# Patient Record
Sex: Female | Born: 1965
Health system: Southern US, Community
[De-identification: ages and names within clinical notes are randomized; demographics above are authoritative.]

## PROBLEM LIST (undated history)

## (undated) HISTORY — PX: REDUCTION MAMMAPLASTY: SUR839

---

## 1999-10-18 ENCOUNTER — Inpatient Hospital Stay (HOSPITAL_COMMUNITY): Admission: AD | Admit: 1999-10-18 | Discharge: 1999-10-20 | Payer: Self-pay | Admitting: Obstetrics and Gynecology

## 2001-03-07 ENCOUNTER — Other Ambulatory Visit: Admission: RE | Admit: 2001-03-07 | Discharge: 2001-03-07 | Payer: Self-pay | Admitting: Obstetrics and Gynecology

## 2002-04-19 ENCOUNTER — Other Ambulatory Visit: Admission: RE | Admit: 2002-04-19 | Discharge: 2002-04-19 | Payer: Self-pay | Admitting: Obstetrics and Gynecology

## 2003-06-13 ENCOUNTER — Other Ambulatory Visit: Admission: RE | Admit: 2003-06-13 | Discharge: 2003-06-13 | Payer: Self-pay | Admitting: Obstetrics and Gynecology

## 2005-06-30 ENCOUNTER — Encounter: Admission: RE | Admit: 2005-06-30 | Discharge: 2005-06-30 | Payer: Self-pay | Admitting: Obstetrics and Gynecology

## 2005-07-29 ENCOUNTER — Encounter: Admission: RE | Admit: 2005-07-29 | Discharge: 2005-07-29 | Payer: Self-pay | Admitting: Obstetrics and Gynecology

## 2006-02-09 ENCOUNTER — Encounter: Admission: RE | Admit: 2006-02-09 | Discharge: 2006-02-09 | Payer: Self-pay | Admitting: Obstetrics and Gynecology

## 2006-07-10 ENCOUNTER — Encounter: Admission: RE | Admit: 2006-07-10 | Discharge: 2006-07-10 | Payer: Self-pay | Admitting: Obstetrics and Gynecology

## 2007-08-09 ENCOUNTER — Encounter: Admission: RE | Admit: 2007-08-09 | Discharge: 2007-08-09 | Payer: Self-pay | Admitting: Obstetrics and Gynecology

## 2007-08-31 ENCOUNTER — Emergency Department (HOSPITAL_COMMUNITY): Admission: EM | Admit: 2007-08-31 | Discharge: 2007-08-31 | Payer: Self-pay | Admitting: Emergency Medicine

## 2008-08-20 ENCOUNTER — Encounter: Admission: RE | Admit: 2008-08-20 | Discharge: 2008-08-20 | Payer: Self-pay | Admitting: Obstetrics and Gynecology

## 2009-03-16 ENCOUNTER — Ambulatory Visit: Payer: Self-pay | Admitting: Vascular Surgery

## 2009-03-19 ENCOUNTER — Ambulatory Visit: Payer: Self-pay | Admitting: Vascular Surgery

## 2009-08-31 ENCOUNTER — Encounter: Admission: RE | Admit: 2009-08-31 | Discharge: 2009-08-31 | Payer: Self-pay | Admitting: Obstetrics and Gynecology

## 2009-09-01 ENCOUNTER — Ambulatory Visit: Payer: Self-pay | Admitting: Vascular Surgery

## 2009-10-05 ENCOUNTER — Ambulatory Visit: Payer: Self-pay | Admitting: Vascular Surgery

## 2009-10-13 ENCOUNTER — Ambulatory Visit: Payer: Self-pay | Admitting: Vascular Surgery

## 2010-08-14 ENCOUNTER — Other Ambulatory Visit: Payer: Self-pay | Admitting: Obstetrics and Gynecology

## 2010-08-14 DIAGNOSIS — Z1231 Encounter for screening mammogram for malignant neoplasm of breast: Secondary | ICD-10-CM

## 2010-08-14 DIAGNOSIS — Z1239 Encounter for other screening for malignant neoplasm of breast: Secondary | ICD-10-CM

## 2010-09-01 ENCOUNTER — Ambulatory Visit
Admission: RE | Admit: 2010-09-01 | Discharge: 2010-09-01 | Disposition: A | Discharge status: Home / Self Care | Payer: BC Managed Care – PPO | Source: Ambulatory Visit | Attending: Obstetrics and Gynecology | Admitting: Obstetrics and Gynecology

## 2010-09-01 DIAGNOSIS — Z1231 Encounter for screening mammogram for malignant neoplasm of breast: Secondary | ICD-10-CM

## 2010-12-07 NOTE — Procedures (Signed)
LOWER EXTREMITY VENOUS REFLUX EXAM   INDICATION:  Left lower extremity painful spider veins.   EXAM:  Using color-flow imaging and pulse Doppler spectral analysis, the  left common femoral, superficial femoral, popliteal, posterior tibial,  greater and lesser saphenous veins are evaluated.  There is evidence  suggesting deep venous insufficiency in the left lower extremity common  femoral vein.   The left saphenofemoral junction is not competent with reflux of  >532milliseconds. The left GSV is not competent with reflux of  >500  milliseconds, when the patient is upright with the caliber as described  below.   The left proximal short saphenous vein demonstrates competency.   GSV Diameter (used if found to be incompetent only)                                            Right    Left  Proximal Greater Saphenous Vein           cm       0.49 cm  Proximal-to-mid-thigh                     cm       cm  Mid thigh                                 cm       0.46 cm  Mid-distal thigh                          cm       cm  Distal thigh                              cm       0.51 cm  Knee                                      cm       0.5 cm   IMPRESSION:  1. Left greater saphenous vein reflux with >500 milliseconds is      identified when the patient is upright with the caliber ranging      from 0.46 cm to 0.51 cm knee to groin.  2. The left greater saphenous vein is not aneurysmal.  3. The left greater saphenous vein is not tortuous.  4. The deep venous system is not competent with reflux of  >500      milliseconds in the left common femoral vein.  5. The left lesser saphenous vein is competent.        ___________________________________________  Quita Skye. Hart Rochester, M.D.   AS/MEDQ  D:  09/01/2009  T:  09/02/2009  Job:  [id number]

## 2010-12-07 NOTE — Assessment & Plan Note (Signed)
OFFICE VISIT   KARYSA, HEFT  DOB:  1965-12-03                                       10/05/2009  ZOXWR#:[id number]   Patient underwent laser ablation of her left great saphenous vein for  venous insufficiency of the left leg with valvular incompetence of the  left great saphenous system with reflux.  She tolerated the procedure  well.  She had previously undergone sclerotherapy and did not require  stab phlebectomies.  She will return in 1 week for follow-up duplex  scan.     Quita Skye Hart Rochester, M.D.  Electronically Signed   JDL/MEDQ  D:  10/05/2009  T:  10/06/2009  Job:  [id number]

## 2010-12-07 NOTE — Procedures (Signed)
LOWER EXTREMITY VENOUS REFLUX EXAM   INDICATION:  Left lower extremity spider veins.   EXAM:  Using color-flow imaging and pulse Doppler spectral analysis, the  left common femoral, superficial femoral, popliteal, posterior tibial,  greater and lesser saphenous veins were evaluated.  There is evidence  suggesting deep venous insufficiency at the common femoral vein level.   The left saphenofemoral junction is competent.  The left GSV is not  competent while the patient is in the upright position with calibers as  described below.   The left proximal short saphenous vein was not adequately visualized.   GSV Diameter (used if found to be incompetent only)                                            Right    Left  Proximal Greater Saphenous Vein           0.4 cm   cm  Proximal-to-mid-thigh                     0.41 cm  cm  Mid thigh                                 cm       cm  Mid-distal thigh                          0.46 cm  cm  Distal thigh                              cm       cm  Knee                                      0.49 cm  cm   IMPRESSION:  1. Left greater saphenous vein reflux is identified as described above      and on the attached worksheet.  2. The left greater saphenous vein is not tortuous.  3. The left common femoral vein demonstrates reflux.  4. The left lesser saphenous vein was not visualized.   ___________________________________________  Quita Skye Hart Rochester, M.D.   CH/MEDQ  D:  03/17/2009  T:  03/17/2009  Job:  [id number]

## 2010-12-07 NOTE — Assessment & Plan Note (Signed)
OFFICE VISIT   Sydney Dean, Sydney Dean  DOB:  01-11-1966                                       09/01/2009  AOZHY#:[id number]   The patient returns today for continued symptoms regarding the venous  insufficiency of the left leg.  This 45 year old healthy female was  evaluated in August 2010 for some heavy throbbing discomfort which  occurred during the day in the thigh and calf area.  She was found to  have reflux in the left great saphenous vein at that time so we have  elected to treat her with primary sclerotherapy of these symptomatic  varicosities and spider veins in the left leg.  She has been wearing  long-leg elastic compression stockings (20 mm-30 mm gradient) and has  tried elevation of her legs as much as her job will permit as well as  analgesics and states that since the sclerotherapy she had a few months  of improvement, but then began having increasing symptomatology  particularly in the left calf area.  She states it is now worse than it  was previously with aching throbbing and heavy discomfort in the left  leg as the day progresses.  She has no history of stasis ulcers,  bleeding or distal edema.   On examination she has palpable dorsalis pedis posterior tibial pulse in  the left leg with no edema.  She does have some reticular veins on the  lateral aspect of the left leg below the knee as well as some medially  of the great saphenous system.   Venous duplex exam today reveals the great saphenous vein has gotten  larger than previous study performed in August and there is reflux in  the saphenofemoral junction to knee level.   Because of her continued symptomatology which are affecting her daily  living as far as working and being on her feet at home, I think she  would be best treated with laser ablation of the left great saphenous  vein.  We will proceed with precertification for this to be done in the  near future.     Quita Skye Hart Rochester,  M.D.  Electronically Signed   JDL/MEDQ  D:  09/01/2009  T:  09/02/2009  Job:  [id number]

## 2010-12-07 NOTE — Assessment & Plan Note (Signed)
OFFICE VISIT   Sydney Dean, Sydney Dean  DOB:  05-Apr-1966                                       10/13/2009  GMWNU#:[id number]   The patient returns 1 week post laser ablation of her left great  saphenous vein for venous insufficiency of the left leg.  She has  experienced aching and throbbing discomfort in the left leg with  heaviness as the day progressed and this is thought to be secondary to  reflux in her left great saphenous system.  She has had minimal  discomfort in the left groin and thigh area following the ablation and  has had no distal edema or other symptoms.  She has been wearing her  elastic stockings.   PHYSICAL EXAMINATION:  Upon exam today there is only minimal tenderness  along the course of the great saphenous vein but no distal edema.  The  ultrasound reveals total closure of the left great saphenous vein from  the distal thigh to the saphenofemoral junction with no evidence of DVT.  She was reassured regarding these findings and will wear her stockings  for 1 more week and return to see [place] on a p.r.n. basis.     Quita Skye Hart Rochester, M.D.  Electronically Signed   JDL/MEDQ  D:  10/13/2009  T:  10/14/2009  Job:  [id number]

## 2010-12-07 NOTE — Consult Note (Signed)
NEW PATIENT CONSULTATION   Sydney Dean, Sydney Dean  DOB:  June 12, 1966                                       03/16/2009  ZOXWR#:[id number]   The patient is a healthy middle-aged female with increasing symptoms of  venous disease in the left leg over the last 9 years.  This venous  problem started with the last pregnancy 9 years ago and she has noted  some increasing varicosities medial aspect of the left calf.  As a day  progresses she gets some heavy throbbing discomfort which is improved by  elevation of the legs.  She does not take pain medicine.  She does not  have a history of bleeding ulceration, thrombophlebitis, deep venous  thrombosis or distal swelling.  She does not wear elastic compression  stockings.  Symptoms have increased with time.  She has no symptoms in  the right leg.   PAST MEDICAL HISTORY:  Negative for diabetes, hypertension, coronary  artery disease, COPD or stroke.   PAST SURGICAL HISTORY:  None.   FAMILY HISTORY:  Negative for coronary artery disease, diabetes, stroke.   SOCIAL HISTORY:  She is married, has two children, works in Chief Financial Officer.  She does not use tobacco and drinks occasional alcohol.   REVIEW OF SYSTEMS:  Unremarkable.   ALLERGIES:  To ERYTHROMYCIN.   MEDICATIONS:  Please see health history examination.   PHYSICAL EXAM:  Vital signs:  Blood pressure 112/80, heart rate 60,  respirations 14.  General:  She is a healthy-appearing middle-aged  female in no apparent distress, alert and oriented x3.  Neck is supple,  3+ carotid pulses palpable.  No bruits are audible.  Neurologic:  Normal  with no palpable adenopathy in the neck.  Chest:  Clear to auscultation.  Cardiovascular:  Regular rhythm.  No murmurs.  Abdomen:  Soft, nontender  with no masses.  She has 3+ femoral, popliteal, and posterior tibial  pulses bilaterally.  Both feet are well-perfused.  Left leg has some  isolated varicosities on the greater saphenous system below the  knee  which are small.  She also has a reticular vein on the lateral aspect of  the left leg in the pretibial region.  There is no distal edema,  hyperpigmentation, ulceration or other evidence of severe venous  insufficiency.   Venous duplex exam reveals a deep system to be normal.  There is mild  reflux in the left great saphenous vein only demonstrated with the  patient upright with borderline size vein.   I looked at the vein with SonoSite and do not think laser ablation of  this vein is necessary or indicated at this time.  I think the best  treatment would be primary sclerotherapy of these symptomatic  varicosities and she would like to schedule that in the near future.   Quita Skye Hart Rochester, M.D.  Electronically Signed   JDL/MEDQ  D:  03/16/2009  T:  03/17/2009  Job:  [id number]   cc:   Lenoard Aden, M.D.

## 2010-12-07 NOTE — Procedures (Signed)
DUPLEX DEEP VENOUS EXAM - LOWER EXTREMITY   INDICATION:  Followup left lower extremity greater saphenous vein  ablation.   HISTORY:  Edema:  No.  Trauma/Surgery:  Yes.  Pain:  Yes.  PE:  No.  Previous DVT:  None.  Anticoagulants:  Other:   DUPLEX EXAM:                CFV   SFV   PopV  PTV    GSV                R  L  R  L  R  L  R   L  R  L  Thrombosis    0  0     0     0      0     +  Spontaneous      +     +     +      +     +  Phasic           +     +     +      +     0  Augmentation     +     +     +      +     0  Compressible     +     +     +      +     0  Competent        +     +     +      +     0   Legend:  + - yes  o - no  p - partial  D - decreased   IMPRESSION:  1. No evidence of deep venous thrombosis noted in the left leg.  2. The left greater saphenous vein graft appears ablated from      saphenofemoral junction to distal thigh.    _____________________________  Quita Skye. Hart Rochester, M.D.   MG/MEDQ  D:  10/13/2009  T:  10/14/2009  Job:  [id number]

## 2011-04-15 LAB — DIFFERENTIAL
Basophils Absolute: 0.1
Eosinophils Absolute: 0.1
Eosinophils Relative: 1
Lymphocytes Relative: 11 — ABNORMAL LOW
Monocytes Absolute: 0.4
Neutro Abs: 14.1 — ABNORMAL HIGH
Neutrophils Relative %: 85 — ABNORMAL HIGH

## 2011-04-15 LAB — CBC
MCHC: 33.8
Platelets: 253
RBC: 4.23
RDW: 12.1

## 2011-04-15 LAB — URINALYSIS, ROUTINE W REFLEX MICROSCOPIC
Bilirubin Urine: NEGATIVE
Glucose, UA: NEGATIVE
Specific Gravity, Urine: 1.022
Urobilinogen, UA: 0.2
pH: 7

## 2011-04-15 LAB — URINE MICROSCOPIC-ADD ON

## 2011-04-15 LAB — WET PREP, GENITAL: WBC, Wet Prep HPF POC: NONE SEEN

## 2011-04-15 LAB — GC/CHLAMYDIA PROBE AMP, GENITAL
Chlamydia, DNA Probe: NEGATIVE
GC Probe Amp, Genital: NEGATIVE

## 2011-04-15 LAB — POCT PREGNANCY, URINE: Preg Test, Ur: NEGATIVE

## 2011-04-15 LAB — RPR: RPR Ser Ql: NONREACTIVE

## 2011-08-25 ENCOUNTER — Other Ambulatory Visit: Payer: Self-pay | Admitting: Obstetrics and Gynecology

## 2011-08-25 DIAGNOSIS — Z1231 Encounter for screening mammogram for malignant neoplasm of breast: Secondary | ICD-10-CM

## 2011-09-14 ENCOUNTER — Ambulatory Visit
Admission: RE | Admit: 2011-09-14 | Discharge: 2011-09-14 | Disposition: A | Discharge status: Home / Self Care | Payer: BC Managed Care – PPO | Source: Ambulatory Visit | Attending: Obstetrics and Gynecology | Admitting: Obstetrics and Gynecology

## 2011-09-14 DIAGNOSIS — Z1231 Encounter for screening mammogram for malignant neoplasm of breast: Secondary | ICD-10-CM

## 2012-08-21 ENCOUNTER — Other Ambulatory Visit: Payer: Self-pay | Admitting: Obstetrics and Gynecology

## 2012-08-21 DIAGNOSIS — Z1231 Encounter for screening mammogram for malignant neoplasm of breast: Secondary | ICD-10-CM

## 2012-09-26 ENCOUNTER — Ambulatory Visit
Admission: RE | Admit: 2012-09-26 | Discharge: 2012-09-26 | Disposition: A | Discharge status: Home / Self Care | Payer: BC Managed Care – PPO | Source: Ambulatory Visit | Attending: Obstetrics and Gynecology | Admitting: Obstetrics and Gynecology

## 2012-09-26 DIAGNOSIS — Z1231 Encounter for screening mammogram for malignant neoplasm of breast: Secondary | ICD-10-CM

## 2012-11-16 ENCOUNTER — Ambulatory Visit (INDEPENDENT_AMBULATORY_CARE_PROVIDER_SITE_OTHER): Payer: BC Managed Care – PPO | Admitting: Licensed Clinical Social Worker

## 2012-11-16 DIAGNOSIS — F4320 Adjustment disorder, unspecified: Secondary | ICD-10-CM

## 2012-11-20 ENCOUNTER — Ambulatory Visit (INDEPENDENT_AMBULATORY_CARE_PROVIDER_SITE_OTHER): Payer: BC Managed Care – PPO | Admitting: Licensed Clinical Social Worker

## 2012-11-20 DIAGNOSIS — Z7189 Other specified counseling: Secondary | ICD-10-CM

## 2012-11-20 DIAGNOSIS — F4320 Adjustment disorder, unspecified: Secondary | ICD-10-CM

## 2012-12-26 ENCOUNTER — Ambulatory Visit (INDEPENDENT_AMBULATORY_CARE_PROVIDER_SITE_OTHER): Payer: BC Managed Care – PPO | Admitting: Licensed Clinical Social Worker

## 2012-12-26 DIAGNOSIS — F4320 Adjustment disorder, unspecified: Secondary | ICD-10-CM

## 2012-12-26 DIAGNOSIS — Z7189 Other specified counseling: Secondary | ICD-10-CM

## 2012-12-31 ENCOUNTER — Ambulatory Visit (INDEPENDENT_AMBULATORY_CARE_PROVIDER_SITE_OTHER): Payer: BC Managed Care – PPO | Admitting: Licensed Clinical Social Worker

## 2012-12-31 DIAGNOSIS — Z7189 Other specified counseling: Secondary | ICD-10-CM

## 2012-12-31 DIAGNOSIS — F4320 Adjustment disorder, unspecified: Secondary | ICD-10-CM

## 2013-01-07 ENCOUNTER — Ambulatory Visit (INDEPENDENT_AMBULATORY_CARE_PROVIDER_SITE_OTHER): Payer: BC Managed Care – PPO | Admitting: Licensed Clinical Social Worker

## 2013-01-07 DIAGNOSIS — F4320 Adjustment disorder, unspecified: Secondary | ICD-10-CM

## 2013-01-07 DIAGNOSIS — Z7189 Other specified counseling: Secondary | ICD-10-CM

## 2013-01-14 ENCOUNTER — Ambulatory Visit (INDEPENDENT_AMBULATORY_CARE_PROVIDER_SITE_OTHER): Payer: BC Managed Care – PPO | Admitting: Licensed Clinical Social Worker

## 2013-01-14 DIAGNOSIS — Z7189 Other specified counseling: Secondary | ICD-10-CM

## 2013-01-14 DIAGNOSIS — F4320 Adjustment disorder, unspecified: Secondary | ICD-10-CM

## 2013-02-08 ENCOUNTER — Ambulatory Visit: Payer: BC Managed Care – PPO | Admitting: Licensed Clinical Social Worker

## 2013-02-11 ENCOUNTER — Ambulatory Visit (INDEPENDENT_AMBULATORY_CARE_PROVIDER_SITE_OTHER): Payer: BC Managed Care – PPO | Admitting: Licensed Clinical Social Worker

## 2013-02-11 DIAGNOSIS — F4320 Adjustment disorder, unspecified: Secondary | ICD-10-CM

## 2013-02-11 DIAGNOSIS — Z7189 Other specified counseling: Secondary | ICD-10-CM

## 2013-03-13 ENCOUNTER — Ambulatory Visit (INDEPENDENT_AMBULATORY_CARE_PROVIDER_SITE_OTHER): Payer: BC Managed Care – PPO | Admitting: Licensed Clinical Social Worker

## 2013-03-13 DIAGNOSIS — Z7189 Other specified counseling: Secondary | ICD-10-CM

## 2013-03-13 DIAGNOSIS — F4320 Adjustment disorder, unspecified: Secondary | ICD-10-CM

## 2013-03-27 ENCOUNTER — Ambulatory Visit (INDEPENDENT_AMBULATORY_CARE_PROVIDER_SITE_OTHER): Payer: BC Managed Care – PPO | Admitting: Licensed Clinical Social Worker

## 2013-03-27 DIAGNOSIS — Z7189 Other specified counseling: Secondary | ICD-10-CM

## 2013-03-27 DIAGNOSIS — F4320 Adjustment disorder, unspecified: Secondary | ICD-10-CM

## 2013-04-08 ENCOUNTER — Ambulatory Visit (INDEPENDENT_AMBULATORY_CARE_PROVIDER_SITE_OTHER): Payer: BC Managed Care – PPO | Admitting: Licensed Clinical Social Worker

## 2013-04-08 DIAGNOSIS — Z7189 Other specified counseling: Secondary | ICD-10-CM

## 2013-04-08 DIAGNOSIS — F4320 Adjustment disorder, unspecified: Secondary | ICD-10-CM

## 2013-04-22 ENCOUNTER — Ambulatory Visit (INDEPENDENT_AMBULATORY_CARE_PROVIDER_SITE_OTHER): Payer: BC Managed Care – PPO | Admitting: Licensed Clinical Social Worker

## 2013-04-22 DIAGNOSIS — F4320 Adjustment disorder, unspecified: Secondary | ICD-10-CM

## 2013-04-22 DIAGNOSIS — Z7189 Other specified counseling: Secondary | ICD-10-CM

## 2013-05-06 ENCOUNTER — Ambulatory Visit: Payer: BC Managed Care – PPO | Admitting: Licensed Clinical Social Worker

## 2013-05-20 ENCOUNTER — Ambulatory Visit (INDEPENDENT_AMBULATORY_CARE_PROVIDER_SITE_OTHER): Payer: BC Managed Care – PPO | Admitting: Licensed Clinical Social Worker

## 2013-05-20 DIAGNOSIS — F4320 Adjustment disorder, unspecified: Secondary | ICD-10-CM

## 2013-05-20 DIAGNOSIS — Z7189 Other specified counseling: Secondary | ICD-10-CM

## 2013-06-03 ENCOUNTER — Ambulatory Visit (INDEPENDENT_AMBULATORY_CARE_PROVIDER_SITE_OTHER): Payer: BC Managed Care – PPO | Admitting: Licensed Clinical Social Worker

## 2013-06-03 DIAGNOSIS — Z7189 Other specified counseling: Secondary | ICD-10-CM

## 2013-06-03 DIAGNOSIS — F4320 Adjustment disorder, unspecified: Secondary | ICD-10-CM

## 2013-06-24 ENCOUNTER — Ambulatory Visit (INDEPENDENT_AMBULATORY_CARE_PROVIDER_SITE_OTHER): Payer: BC Managed Care – PPO | Admitting: Licensed Clinical Social Worker

## 2013-06-24 DIAGNOSIS — Z7189 Other specified counseling: Secondary | ICD-10-CM

## 2013-06-24 DIAGNOSIS — F4320 Adjustment disorder, unspecified: Secondary | ICD-10-CM

## 2013-07-10 ENCOUNTER — Ambulatory Visit (INDEPENDENT_AMBULATORY_CARE_PROVIDER_SITE_OTHER): Payer: BC Managed Care – PPO | Admitting: Licensed Clinical Social Worker

## 2013-07-10 DIAGNOSIS — F4320 Adjustment disorder, unspecified: Secondary | ICD-10-CM

## 2013-07-10 DIAGNOSIS — Z7189 Other specified counseling: Secondary | ICD-10-CM

## 2013-08-02 ENCOUNTER — Ambulatory Visit (INDEPENDENT_AMBULATORY_CARE_PROVIDER_SITE_OTHER): Payer: BC Managed Care – PPO | Admitting: Licensed Clinical Social Worker

## 2013-08-02 DIAGNOSIS — Z630 Problems in relationship with spouse or partner: Secondary | ICD-10-CM

## 2013-08-02 DIAGNOSIS — Z7189 Other specified counseling: Secondary | ICD-10-CM

## 2013-08-02 DIAGNOSIS — F4320 Adjustment disorder, unspecified: Secondary | ICD-10-CM

## 2013-08-09 ENCOUNTER — Ambulatory Visit: Payer: BC Managed Care – PPO | Admitting: Licensed Clinical Social Worker

## 2013-08-19 ENCOUNTER — Ambulatory Visit (INDEPENDENT_AMBULATORY_CARE_PROVIDER_SITE_OTHER): Payer: BC Managed Care – PPO | Admitting: Licensed Clinical Social Worker

## 2013-08-19 DIAGNOSIS — Z7189 Other specified counseling: Secondary | ICD-10-CM

## 2013-08-19 DIAGNOSIS — F4321 Adjustment disorder with depressed mood: Secondary | ICD-10-CM

## 2013-08-19 DIAGNOSIS — Z630 Problems in relationship with spouse or partner: Secondary | ICD-10-CM

## 2013-08-26 ENCOUNTER — Other Ambulatory Visit: Payer: Self-pay

## 2013-08-26 DIAGNOSIS — Z9889 Other specified postprocedural states: Secondary | ICD-10-CM

## 2013-08-26 DIAGNOSIS — Z1231 Encounter for screening mammogram for malignant neoplasm of breast: Secondary | ICD-10-CM

## 2013-09-04 ENCOUNTER — Ambulatory Visit (INDEPENDENT_AMBULATORY_CARE_PROVIDER_SITE_OTHER): Payer: BC Managed Care – PPO | Admitting: Licensed Clinical Social Worker

## 2013-09-04 DIAGNOSIS — Z7189 Other specified counseling: Secondary | ICD-10-CM

## 2013-09-04 DIAGNOSIS — F4320 Adjustment disorder, unspecified: Secondary | ICD-10-CM

## 2013-09-04 DIAGNOSIS — Z630 Problems in relationship with spouse or partner: Secondary | ICD-10-CM

## 2013-09-30 ENCOUNTER — Ambulatory Visit
Admission: RE | Admit: 2013-09-30 | Discharge: 2013-09-30 | Disposition: A | Discharge status: Home / Self Care | Payer: BC Managed Care – PPO | Source: Ambulatory Visit

## 2013-09-30 DIAGNOSIS — Z9889 Other specified postprocedural states: Secondary | ICD-10-CM

## 2013-09-30 DIAGNOSIS — Z1231 Encounter for screening mammogram for malignant neoplasm of breast: Secondary | ICD-10-CM

## 2014-09-15 ENCOUNTER — Other Ambulatory Visit: Payer: Self-pay

## 2014-09-15 DIAGNOSIS — Z1231 Encounter for screening mammogram for malignant neoplasm of breast: Secondary | ICD-10-CM

## 2014-10-30 ENCOUNTER — Ambulatory Visit
Admission: RE | Admit: 2014-10-30 | Discharge: 2014-10-30 | Disposition: A | Discharge status: Home / Self Care | Payer: Self-pay | Source: Ambulatory Visit

## 2014-10-30 DIAGNOSIS — Z1231 Encounter for screening mammogram for malignant neoplasm of breast: Secondary | ICD-10-CM

## 2015-11-03 ENCOUNTER — Other Ambulatory Visit: Payer: Self-pay

## 2015-11-03 DIAGNOSIS — Z1231 Encounter for screening mammogram for malignant neoplasm of breast: Secondary | ICD-10-CM

## 2015-11-17 DIAGNOSIS — L814 Other melanin hyperpigmentation: Secondary | ICD-10-CM | POA: Diagnosis not present

## 2015-11-17 DIAGNOSIS — L821 Other seborrheic keratosis: Secondary | ICD-10-CM | POA: Diagnosis not present

## 2015-11-17 DIAGNOSIS — D225 Melanocytic nevi of trunk: Secondary | ICD-10-CM | POA: Diagnosis not present

## 2015-11-17 DIAGNOSIS — D1800 Hemangioma unspecified site: Secondary | ICD-10-CM | POA: Diagnosis not present

## 2015-12-02 ENCOUNTER — Ambulatory Visit: Payer: BLUE CROSS/BLUE SHIELD

## 2015-12-15 ENCOUNTER — Ambulatory Visit
Admission: RE | Admit: 2015-12-15 | Discharge: 2015-12-15 | Disposition: A | Discharge status: Home / Self Care | Payer: BLUE CROSS/BLUE SHIELD | Source: Ambulatory Visit

## 2015-12-15 DIAGNOSIS — Z1231 Encounter for screening mammogram for malignant neoplasm of breast: Secondary | ICD-10-CM | POA: Diagnosis not present

## 2016-11-15 DIAGNOSIS — Z01419 Encounter for gynecological examination (general) (routine) without abnormal findings: Secondary | ICD-10-CM | POA: Diagnosis not present

## 2016-11-15 DIAGNOSIS — Z6820 Body mass index (BMI) 20.0-20.9, adult: Secondary | ICD-10-CM | POA: Diagnosis not present

## 2016-11-17 DIAGNOSIS — D225 Melanocytic nevi of trunk: Secondary | ICD-10-CM | POA: Diagnosis not present

## 2016-11-17 DIAGNOSIS — L821 Other seborrheic keratosis: Secondary | ICD-10-CM | POA: Diagnosis not present

## 2016-11-17 DIAGNOSIS — L814 Other melanin hyperpigmentation: Secondary | ICD-10-CM | POA: Diagnosis not present

## 2016-11-17 DIAGNOSIS — D1800 Hemangioma unspecified site: Secondary | ICD-10-CM | POA: Diagnosis not present

## 2017-03-21 DIAGNOSIS — Z1211 Encounter for screening for malignant neoplasm of colon: Secondary | ICD-10-CM | POA: Diagnosis not present

## 2017-05-15 DIAGNOSIS — D120 Benign neoplasm of cecum: Secondary | ICD-10-CM | POA: Diagnosis not present

## 2017-05-15 DIAGNOSIS — Z1211 Encounter for screening for malignant neoplasm of colon: Secondary | ICD-10-CM | POA: Diagnosis not present

## 2017-05-15 DIAGNOSIS — K635 Polyp of colon: Secondary | ICD-10-CM | POA: Diagnosis not present

## 2017-12-28 DIAGNOSIS — Z1151 Encounter for screening for human papillomavirus (HPV): Secondary | ICD-10-CM | POA: Diagnosis not present

## 2017-12-28 DIAGNOSIS — Z01419 Encounter for gynecological examination (general) (routine) without abnormal findings: Secondary | ICD-10-CM | POA: Diagnosis not present

## 2017-12-28 DIAGNOSIS — Z6820 Body mass index (BMI) 20.0-20.9, adult: Secondary | ICD-10-CM | POA: Diagnosis not present

## 2018-01-18 DIAGNOSIS — Z13220 Encounter for screening for lipoid disorders: Secondary | ICD-10-CM | POA: Diagnosis not present

## 2018-01-18 DIAGNOSIS — N939 Abnormal uterine and vaginal bleeding, unspecified: Secondary | ICD-10-CM | POA: Diagnosis not present

## 2018-01-18 DIAGNOSIS — N920 Excessive and frequent menstruation with regular cycle: Secondary | ICD-10-CM | POA: Diagnosis not present

## 2018-01-18 DIAGNOSIS — N930 Postcoital and contact bleeding: Secondary | ICD-10-CM | POA: Diagnosis not present

## 2018-02-15 ENCOUNTER — Encounter: Payer: Self-pay | Admitting: Family Medicine

## 2018-02-15 ENCOUNTER — Ambulatory Visit: Payer: BLUE CROSS/BLUE SHIELD | Admitting: Family Medicine

## 2018-02-15 DIAGNOSIS — M545 Low back pain, unspecified: Secondary | ICD-10-CM

## 2018-02-15 NOTE — Patient Instructions (Signed)
Given you're not improving with extensive physical therapy we will go ahead with an MRI of your lumbar spine. I will call you with the results and next steps.

## 2018-02-15 NOTE — Assessment & Plan Note (Signed)
concerning that she has had 2 years of low back pain that has been progressing and not responding to extensive physical therapy, had prednisone dose pack as well that did not provide lasting relief.  Concerned she may have disc herniation more to right side.  Will go ahead with MRI to assess.  Consider ESI, neurosurgery referral depending on results.  We discussed ibuprofen, aleve which she could take as needed.

## 2018-02-15 NOTE — Progress Notes (Signed)
PCP: Patient, No Pcp Per  Subjective:   HPI: Patient is a 52 y.o. female here for back pain.  Patient reports she's had about 2 years of low back pain. She reports pain has progressed slowly over this time - was initially on the right, about a year ago included left side of low back. Now radiating into both hips. She tried adjustments to desk at work (sit-stand) without much benefit. Pain is worse with sitting, better with lying down and standing. She's gone to physical therapy weekly for past 2 years, does stretching, home exercises, and massage. Not tried medications for this but she had prednisone dose pack for poison ivy about a year ago and didn't appreciably change her back pain long-term. Pain level 6/10 and sharp. No bowel/bladder dysfunction. No skin changes.  History reviewed. No pertinent past medical history.  Current Outpatient Medications on File Prior to Visit  Medication Sig Dispense Refill  . TAYTULLA 1-20 MG-MCG(24) CAPS   10   No current facility-administered medications on file prior to visit.     History reviewed. No pertinent surgical history.  Allergies  Allergen Reactions  . Erythromycin     Social History   Socioeconomic History  . Marital status: Single    Spouse name: Not on file  . Number of children: Not on file  . Years of education: Not on file  . Highest education level: Not on file  Occupational History  . Not on file  Social Needs  . Financial resource strain: Not on file  . Food insecurity:    Worry: Not on file    Inability: Not on file  . Transportation needs:    Medical: Not on file    Non-medical: Not on file  Tobacco Use  . Smoking status: Never Smoker  . Smokeless tobacco: Never Used  Substance and Sexual Activity  . Alcohol use: Not on file  . Drug use: Not on file  . Sexual activity: Not on file  Lifestyle  . Physical activity:    Days per week: Not on file    Minutes per session: Not on file  . Stress: Not on  file  Relationships  . Social connections:    Talks on phone: Not on file    Gets together: Not on file    Attends religious service: Not on file    Active member of club or organization: Not on file    Attends meetings of clubs or organizations: Not on file    Relationship status: Not on file  . Intimate partner violence:    Fear of current or ex partner: Not on file    Emotionally abused: Not on file    Physically abused: Not on file    Forced sexual activity: Not on file  Other Topics Concern  . Not on file  Social History Narrative  . Not on file    History reviewed. No pertinent family history.  BP 110/75   Pulse 89   Ht 5\' 11"  (1.803 m)   Wt 135 lb (61.2 kg)   BMI 18.83 kg/m   Review of Systems: See HPI above.     Objective:  Physical Exam:  Gen: NAD, comfortable in exam room  Back: No gross deformity, scoliosis. TTP mildly right paraspinal lumbar region.  No midline or bony TTP.  No SI, piriformis, other tenderness. FROM with pain on flexion and left lateral rotation.  No pain on extension, right rotation. Strength LEs 5/5 all muscle groups.  2+ MSRs in patellar and achilles tendons, equal bilaterally. Negative SLRs. Sensation intact to light touch bilaterally.  Bilateral hips: No deformity. FROM with 5/5 strength. No tenderness to palpation. NVI distally. Negative logroll bilateral hips Negative fabers and piriformis stretches.   Assessment & Plan:  1. Low back pain - concerning that she has had 2 years of low back pain that has been progressing and not responding to extensive physical therapy, had prednisone dose pack as well that did not provide lasting relief.  Concerned she may have disc herniation more to right side.  Will go ahead with MRI to assess.  Consider ESI, neurosurgery referral depending on results.  We discussed ibuprofen, aleve which she could take as needed.

## 2018-02-16 NOTE — Addendum Note (Signed)
Addended by: Kathi SimpersWISE, Elvi Leventhal F on: 02/16/2018 08:18 AM   Modules accepted: Orders

## 2018-02-24 ENCOUNTER — Ambulatory Visit
Admission: RE | Admit: 2018-02-24 | Discharge: 2018-02-24 | Disposition: A | Discharge status: Home / Self Care | Payer: BLUE CROSS/BLUE SHIELD | Source: Ambulatory Visit | Attending: Family Medicine | Admitting: Family Medicine

## 2018-02-24 DIAGNOSIS — M545 Low back pain, unspecified: Secondary | ICD-10-CM

## 2018-02-27 ENCOUNTER — Ambulatory Visit (INDEPENDENT_AMBULATORY_CARE_PROVIDER_SITE_OTHER): Payer: BLUE CROSS/BLUE SHIELD | Admitting: Family Medicine

## 2018-02-27 DIAGNOSIS — M545 Low back pain, unspecified: Secondary | ICD-10-CM

## 2018-02-27 NOTE — Progress Notes (Signed)
MRI results reviewed and discussed with patient - see note attached to her MRI for details.

## 2018-03-21 DIAGNOSIS — M47816 Spondylosis without myelopathy or radiculopathy, lumbar region: Secondary | ICD-10-CM | POA: Diagnosis not present

## 2018-04-05 DIAGNOSIS — M47816 Spondylosis without myelopathy or radiculopathy, lumbar region: Secondary | ICD-10-CM | POA: Diagnosis not present

## 2018-04-06 DIAGNOSIS — M5136 Other intervertebral disc degeneration, lumbar region: Secondary | ICD-10-CM | POA: Diagnosis not present

## 2018-04-06 DIAGNOSIS — M533 Sacrococcygeal disorders, not elsewhere classified: Secondary | ICD-10-CM | POA: Diagnosis not present

## 2018-04-06 DIAGNOSIS — M47816 Spondylosis without myelopathy or radiculopathy, lumbar region: Secondary | ICD-10-CM | POA: Diagnosis not present

## 2018-04-27 DIAGNOSIS — M533 Sacrococcygeal disorders, not elsewhere classified: Secondary | ICD-10-CM | POA: Diagnosis not present

## 2018-05-11 ENCOUNTER — Other Ambulatory Visit: Payer: Self-pay | Admitting: Obstetrics and Gynecology

## 2018-05-11 DIAGNOSIS — Z1231 Encounter for screening mammogram for malignant neoplasm of breast: Secondary | ICD-10-CM

## 2018-06-12 DIAGNOSIS — F54 Psychological and behavioral factors associated with disorders or diseases classified elsewhere: Secondary | ICD-10-CM | POA: Diagnosis not present

## 2018-06-12 DIAGNOSIS — G8929 Other chronic pain: Secondary | ICD-10-CM | POA: Diagnosis not present

## 2018-06-12 DIAGNOSIS — M545 Low back pain: Secondary | ICD-10-CM | POA: Diagnosis not present

## 2018-06-22 DIAGNOSIS — N926 Irregular menstruation, unspecified: Secondary | ICD-10-CM | POA: Diagnosis not present

## 2018-06-26 ENCOUNTER — Ambulatory Visit
Admission: RE | Admit: 2018-06-26 | Discharge: 2018-06-26 | Disposition: A | Discharge status: Home / Self Care | Payer: BLUE CROSS/BLUE SHIELD | Source: Ambulatory Visit | Attending: Obstetrics and Gynecology | Admitting: Obstetrics and Gynecology

## 2018-06-26 DIAGNOSIS — Z1231 Encounter for screening mammogram for malignant neoplasm of breast: Secondary | ICD-10-CM | POA: Diagnosis not present

## 2018-06-28 DIAGNOSIS — M47816 Spondylosis without myelopathy or radiculopathy, lumbar region: Secondary | ICD-10-CM | POA: Diagnosis not present

## 2018-06-28 DIAGNOSIS — M5136 Other intervertebral disc degeneration, lumbar region: Secondary | ICD-10-CM | POA: Diagnosis not present

## 2018-06-28 DIAGNOSIS — M533 Sacrococcygeal disorders, not elsewhere classified: Secondary | ICD-10-CM | POA: Diagnosis not present

## 2018-07-13 DIAGNOSIS — M47816 Spondylosis without myelopathy or radiculopathy, lumbar region: Secondary | ICD-10-CM | POA: Diagnosis not present

## 2018-07-16 DIAGNOSIS — N939 Abnormal uterine and vaginal bleeding, unspecified: Secondary | ICD-10-CM | POA: Diagnosis not present

## 2018-08-21 DIAGNOSIS — N920 Excessive and frequent menstruation with regular cycle: Secondary | ICD-10-CM | POA: Diagnosis not present

## 2018-08-30 ENCOUNTER — Other Ambulatory Visit: Payer: Self-pay | Admitting: Obstetrics and Gynecology

## 2018-09-10 DIAGNOSIS — M5136 Other intervertebral disc degeneration, lumbar region: Secondary | ICD-10-CM | POA: Diagnosis not present

## 2018-09-10 DIAGNOSIS — M533 Sacrococcygeal disorders, not elsewhere classified: Secondary | ICD-10-CM | POA: Diagnosis not present

## 2018-09-12 ENCOUNTER — Ambulatory Visit (HOSPITAL_BASED_OUTPATIENT_CLINIC_OR_DEPARTMENT_OTHER): Admit: 2018-09-12 | Payer: BLUE CROSS/BLUE SHIELD | Admitting: Obstetrics and Gynecology

## 2018-09-12 ENCOUNTER — Encounter (HOSPITAL_BASED_OUTPATIENT_CLINIC_OR_DEPARTMENT_OTHER): Payer: Self-pay

## 2018-09-12 SURGERY — DILATATION & CURETTAGE/HYSTEROSCOPY WITH MYOSURE
Anesthesia: Choice

## 2019-06-26 ENCOUNTER — Other Ambulatory Visit: Payer: Self-pay | Admitting: Obstetrics and Gynecology

## 2019-06-26 DIAGNOSIS — Z1231 Encounter for screening mammogram for malignant neoplasm of breast: Secondary | ICD-10-CM

## 2019-08-19 ENCOUNTER — Ambulatory Visit: Payer: BLUE CROSS/BLUE SHIELD

## 2019-09-18 ENCOUNTER — Ambulatory Visit
Admission: RE | Admit: 2019-09-18 | Discharge: 2019-09-18 | Disposition: A | Discharge status: Home / Self Care | Payer: BLUE CROSS/BLUE SHIELD | Source: Ambulatory Visit | Attending: Obstetrics and Gynecology | Admitting: Obstetrics and Gynecology

## 2019-09-18 ENCOUNTER — Other Ambulatory Visit: Payer: Self-pay

## 2019-09-18 DIAGNOSIS — Z1231 Encounter for screening mammogram for malignant neoplasm of breast: Secondary | ICD-10-CM

## 2020-05-04 IMAGING — MR SPINE^L-SPINE
5 series · 45 of 48 positions shown · non-contrast
Comparison: None.

CLINICAL DATA: Chronic low back pain worsening over the last 2
years. Buttock and leg pain worse on the right.

EXAM:
MRI LUMBAR SPINE WITHOUT CONTRAST
TECHNIQUE: Multiplanar, multisequence MR imaging of the lumbar spine was
performed. No intravenous contrast was administered.

[Series 3: T2 post-contrast · sagittal · 4.0mm · 0.88mm/px · 6 of 13 slices shown]
[im 1/13]
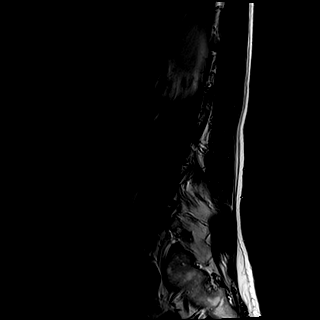
[im 3/13]
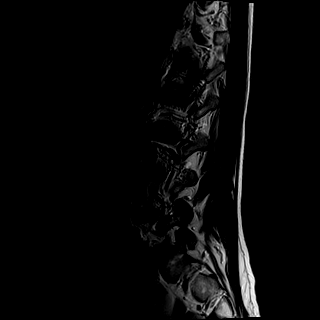
[im 5/13]
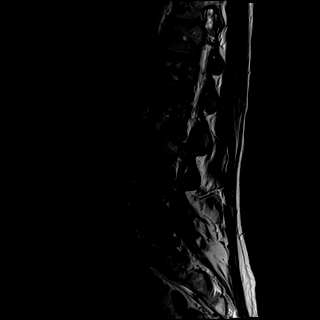
[im 8/13]
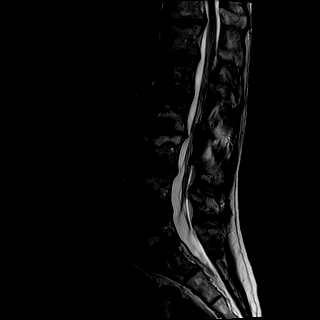
[im 10/13]
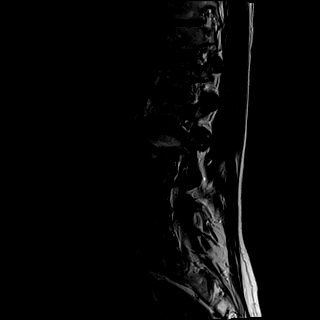
[im 13/13]
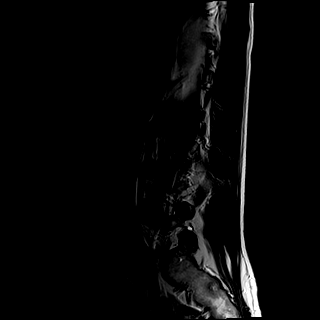

[Series 4: T1 · sagittal · 4.0mm · 0.88mm/px · 5 of 13 slices shown (1 of 2)]
[im 1/13]
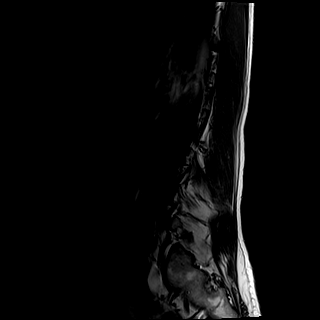
[im 4/13]
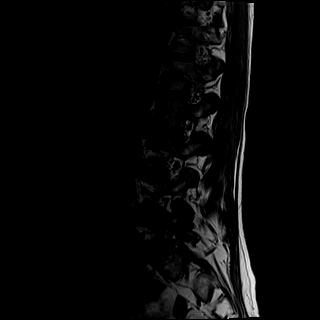
[im 7/13]
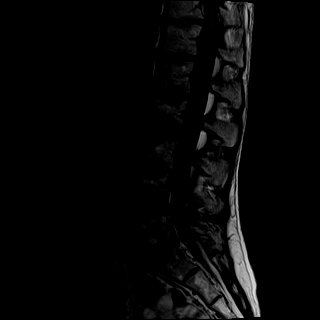
[im 10/13]
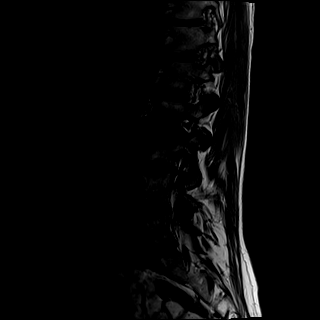
[im 13/13]
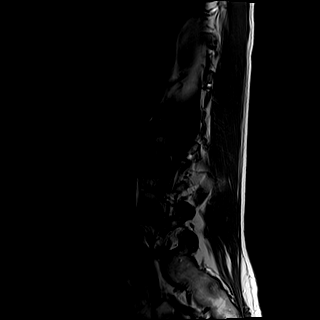

[Series 5: tirm sag · sagittal · 4.0mm · 0.55mm/px · 5 of 13 slices shown]
[im 1/13]
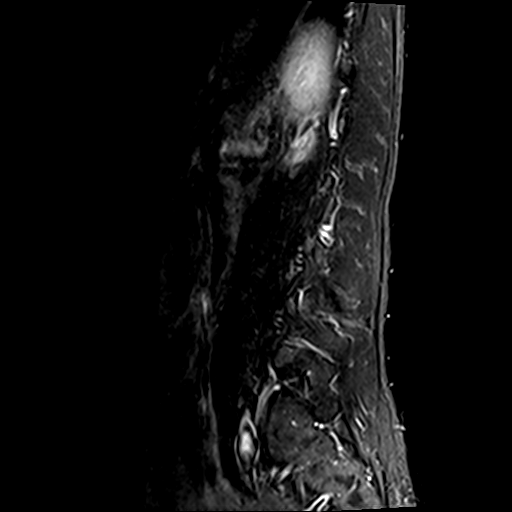
[im 4/13]
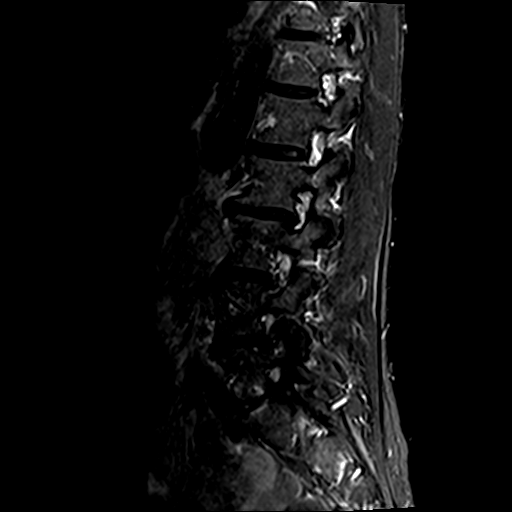
[im 7/13]
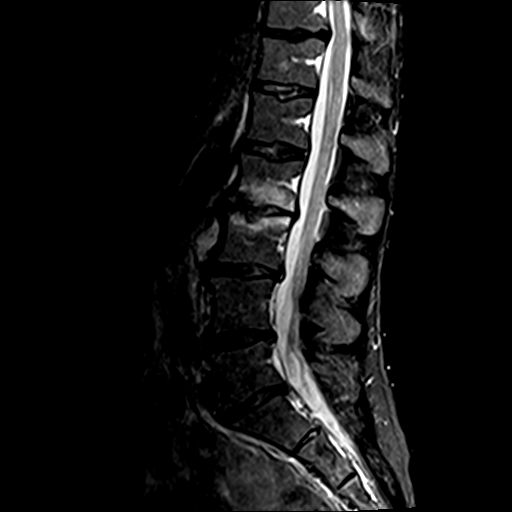
[im 10/13]
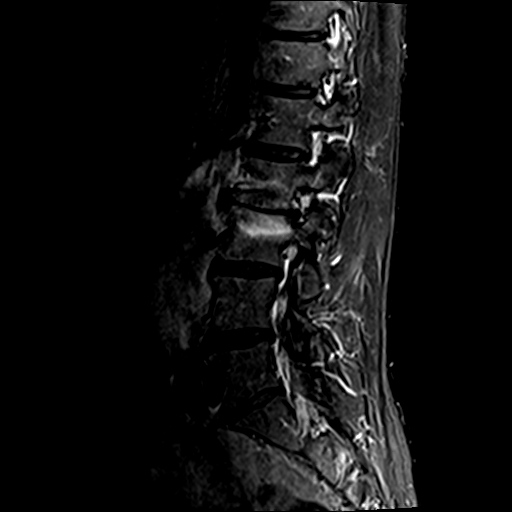
[im 13/13]
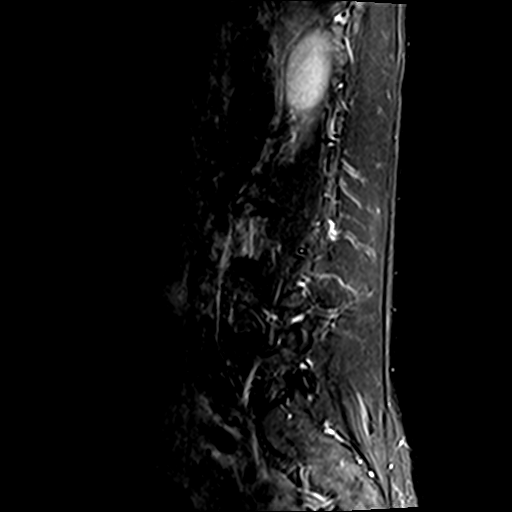

[Series 6: T1 · axial · 4.0mm · 0.78mm/px · z∈[-90,+120]mm · 13 of 40 slices shown (2 of 2)]
[im 1/40]
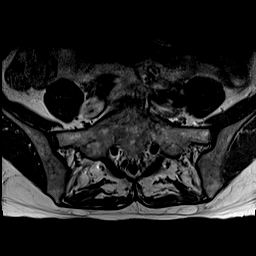
[im 3/40]
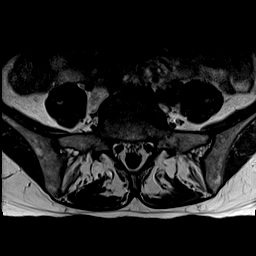
[im 6/40]
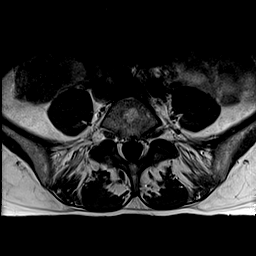
[im 8/40]
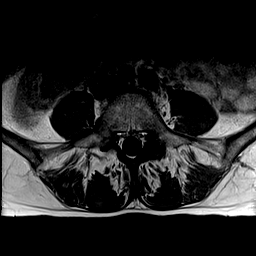
[im 11/40]
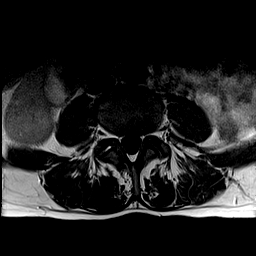
[im 14/40]
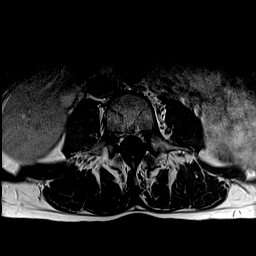
[im 16/40]
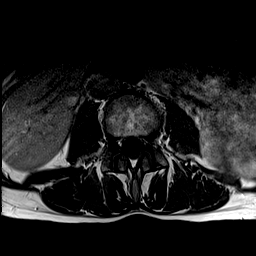
[im 19/40]
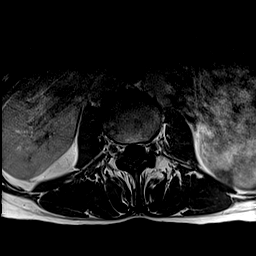
[im 21/40]
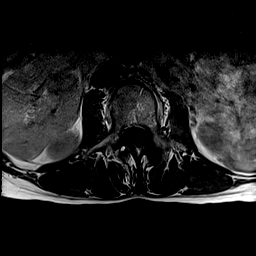
[im 24/40]
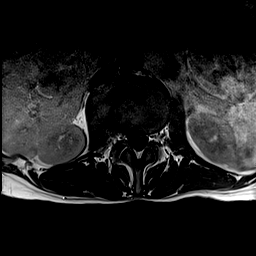
[im 29/40]
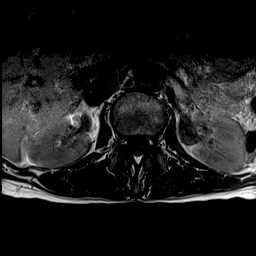
[im 34/40]
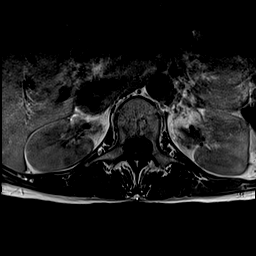
[im 40/40]
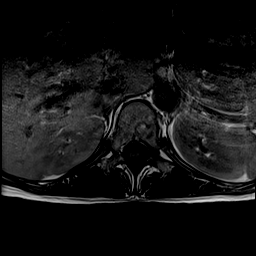

[Series 7: T2 · axial · 4.0mm · 0.78mm/px · z∈[-90,+120]mm · 16 of 40 slices shown]
[im 1/40]
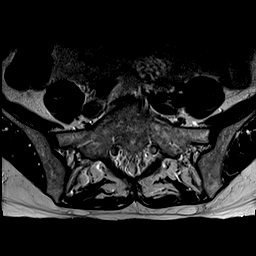
[im 3/40]
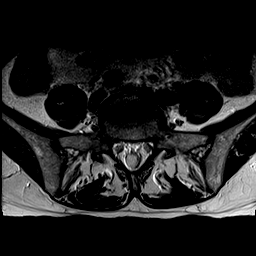
[im 6/40]
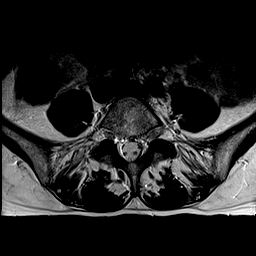
[im 8/40]
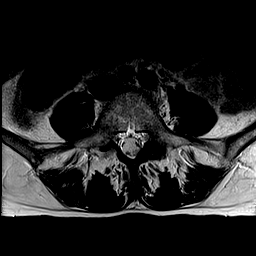
[im 11/40]
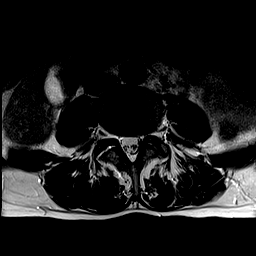
[im 14/40]
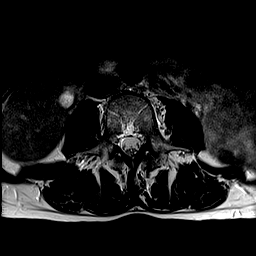
[im 16/40]
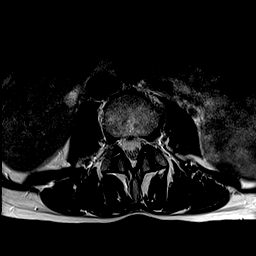
[im 19/40]
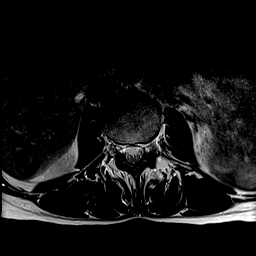
[im 21/40]
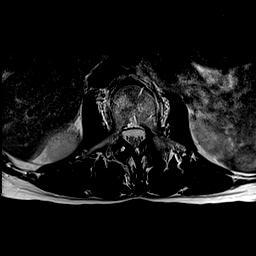
[im 24/40]
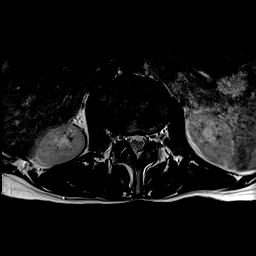
[im 27/40]
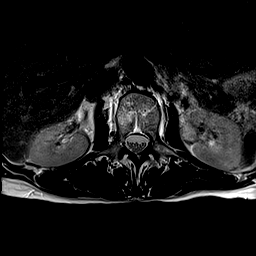
[im 29/40]
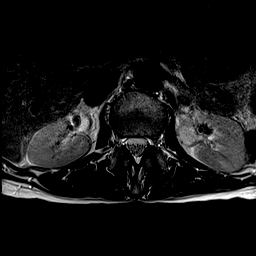
[im 32/40]
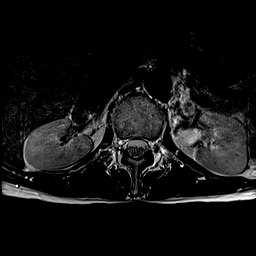
[im 34/40]
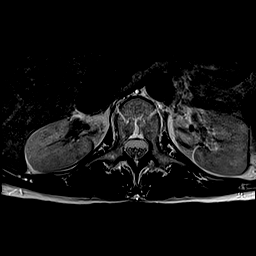
[im 37/40]
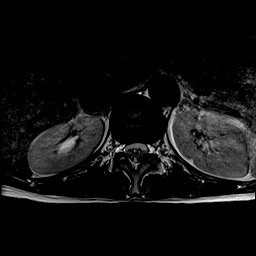
[im 40/40]
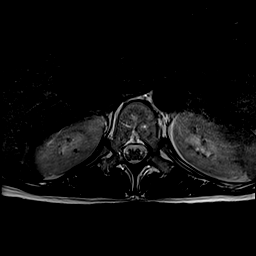

[45 of 48 positions shown; findings below may reference images not displayed]

FINDINGS: Segmentation:  5 lumbar type vertebral bodies.

Alignment:  Normal

Vertebrae:  Discogenic edematous change at the L2-3 level.

Conus medullaris and cauda equina: Conus extends to the L1 level.
Conus and cauda equina appear normal.

Paraspinal and other soft tissues: Negative

Disc levels:

T11-12: Bulging of the disc.  No stenosis or neural compression.

T12-L1: Unremarkable interspace.

L1-2: Bulging of the disc.  No stenosis.

L2-3: Advanced chronic disc degeneration with near complete loss of
disc height. Mild bulging of the disc. Mild narrowing of the right
lateral recess and intervertebral foramen on the right. Discogenic
edematous change of the vertebral bodies which could be associated
with back pain.

L3-4: Annular fissures and annular bulging. Mild narrowing of the
lateral recesses but no visible neural compression.

L4-5: Mild annular bulging. Mild facet degeneration. No compressive
stenosis.

L5-S1: Endplate osteophytes and shallow left posterolateral disc
herniation. Mild narrowing of the subarticular lateral recess and
proximal foramen on the left but no definite left-sided neural
compression.
IMPRESSION: Advanced disc degeneration at L2-3 with near complete loss of disc
height. Edematous change of the endplates at this level which could
be associated with back pain. Endplate osteophytes and bulging of
the disc more prominent towards the right with narrowing of the
right lateral recess and intervertebral foramen on the right that
could possibly cause right-sided neural compression or irritation.

Non-compressive annular bulges at L3-4 and L4-5.

L5-S1 shallow left posterolateral disc protrusion with narrowing of
the subarticular lateral recess and proximal foramen on the left
that could possibly cause left-sided neural symptoms.

## 2020-12-31 ENCOUNTER — Other Ambulatory Visit: Payer: Self-pay | Admitting: Obstetrics and Gynecology

## 2020-12-31 DIAGNOSIS — Z1231 Encounter for screening mammogram for malignant neoplasm of breast: Secondary | ICD-10-CM

## 2021-01-06 ENCOUNTER — Ambulatory Visit
Admission: RE | Admit: 2021-01-06 | Discharge: 2021-01-06 | Disposition: A | Discharge status: Home / Self Care | Payer: 59 | Source: Ambulatory Visit | Attending: Obstetrics and Gynecology | Admitting: Obstetrics and Gynecology

## 2021-01-06 ENCOUNTER — Other Ambulatory Visit: Payer: Self-pay

## 2021-01-06 DIAGNOSIS — Z1231 Encounter for screening mammogram for malignant neoplasm of breast: Secondary | ICD-10-CM

## 2021-12-14 ENCOUNTER — Other Ambulatory Visit: Payer: Self-pay | Admitting: Obstetrics and Gynecology

## 2021-12-14 DIAGNOSIS — Z1231 Encounter for screening mammogram for malignant neoplasm of breast: Secondary | ICD-10-CM

## 2022-01-10 ENCOUNTER — Ambulatory Visit: Payer: 59

## 2022-01-11 ENCOUNTER — Ambulatory Visit
Admission: RE | Admit: 2022-01-11 | Discharge: 2022-01-11 | Disposition: A | Discharge status: Home / Self Care | Payer: 59 | Source: Ambulatory Visit | Attending: Obstetrics and Gynecology | Admitting: Obstetrics and Gynecology

## 2022-01-11 DIAGNOSIS — Z1231 Encounter for screening mammogram for malignant neoplasm of breast: Secondary | ICD-10-CM

## 2022-07-21 DIAGNOSIS — L739 Follicular disorder, unspecified: Secondary | ICD-10-CM | POA: Insufficient documentation

## 2022-12-05 ENCOUNTER — Other Ambulatory Visit: Payer: Self-pay | Admitting: Obstetrics and Gynecology

## 2022-12-05 DIAGNOSIS — Z1231 Encounter for screening mammogram for malignant neoplasm of breast: Secondary | ICD-10-CM

## 2023-01-16 ENCOUNTER — Ambulatory Visit: Payer: 59

## 2023-02-08 ENCOUNTER — Ambulatory Visit: Payer: 59

## 2023-02-15 ENCOUNTER — Ambulatory Visit: Admission: RE | Admit: 2023-02-15 | Payer: 59 | Source: Ambulatory Visit

## 2023-02-15 DIAGNOSIS — Z1231 Encounter for screening mammogram for malignant neoplasm of breast: Secondary | ICD-10-CM

## 2023-05-01 DIAGNOSIS — N950 Postmenopausal bleeding: Secondary | ICD-10-CM | POA: Insufficient documentation

## 2023-05-01 DIAGNOSIS — N951 Menopausal and female climacteric states: Secondary | ICD-10-CM | POA: Insufficient documentation

## 2023-11-20 DIAGNOSIS — N840 Polyp of corpus uteri: Secondary | ICD-10-CM | POA: Insufficient documentation

## 2024-01-10 ENCOUNTER — Other Ambulatory Visit: Payer: Self-pay | Admitting: Obstetrics and Gynecology

## 2024-01-10 DIAGNOSIS — Z1231 Encounter for screening mammogram for malignant neoplasm of breast: Secondary | ICD-10-CM

## 2024-02-22 ENCOUNTER — Ambulatory Visit

## 2024-03-05 ENCOUNTER — Other Ambulatory Visit: Payer: Self-pay | Admitting: Obstetrics & Gynecology

## 2024-03-05 ENCOUNTER — Ambulatory Visit
Admission: RE | Admit: 2024-03-05 | Discharge: 2024-03-05 | Disposition: A | Discharge status: Home / Self Care | Source: Ambulatory Visit | Attending: Obstetrics and Gynecology | Admitting: Obstetrics and Gynecology

## 2024-03-05 ENCOUNTER — Other Ambulatory Visit: Payer: Self-pay | Admitting: Interventional Radiology

## 2024-03-05 DIAGNOSIS — D259 Leiomyoma of uterus, unspecified: Secondary | ICD-10-CM

## 2024-03-05 DIAGNOSIS — Z1231 Encounter for screening mammogram for malignant neoplasm of breast: Secondary | ICD-10-CM

## 2024-03-12 ENCOUNTER — Ambulatory Visit

## 2024-03-20 ENCOUNTER — Ambulatory Visit
Admission: RE | Admit: 2024-03-20 | Discharge: 2024-03-20 | Disposition: A | Discharge status: Home / Self Care | Source: Ambulatory Visit | Attending: Interventional Radiology | Admitting: Interventional Radiology

## 2024-03-20 DIAGNOSIS — D259 Leiomyoma of uterus, unspecified: Secondary | ICD-10-CM

## 2024-03-20 MED ORDER — GADOPICLENOL 0.5 MMOL/ML IV SOLN
6.0000 mL | Freq: Once | INTRAVENOUS | Status: AC | PRN
  Administered 2024-03-20: 6 mL via INTRAVENOUS

## 2024-03-28 NOTE — Progress Notes (Signed)
 Chief Complaint: Patient was seen in consultation today for symptomatic uterine fibroids.   Referring Physician(s): Mody,Vaishali  History of Present Illness: Sydney Dean is a 58 y.o. female with a medical history significant for chronic low back pain, anemia and uterine fibroids. She has been followed closely by her gynecology team for postmenopausal bleeding that is likely secondary to uterine fibroids. A hysteroscopy with endometrial biopsy was performed October 2024 with pathology showing benign findings. In May 2025 she had another hysteroscopy with polyp and myoma resection again with benign pathology. She is also on HRT.   She was seen urgently in early July due to heavy bleeding. She was started on megace but she doesn't tolerate this drug well. A recent in-office ultrasound showed a bulky uterus with multiple fibroids. Her gynecologist discussed several treatment options with her and the patient is most interested in a minimally invasive approach. Interventional Radiology was consulted and she was considered to be a possible candidate for uterine artery embolization.   A pelvic MRI was ordered for further work up and this was completed 03/20/24. The patient presents to the outpatient IR clinic to review these results and discuss [place] as a potential treatment option.   No past medical history on file.  Past Surgical History:  Procedure Laterality Date   REDUCTION MAMMAPLASTY      Allergies: Erythromycin  Medications: Prior to Admission medications   Medication Sig Start Date End Date Taking? Authorizing Provider  TAYTULLA 1-20 MG-MCG(24) CAPS  01/29/18   [provider]     No family history on file.  Social History   Socioeconomic History   Marital status: Single    Spouse name: Not on file   Number of children: Not on file   Years of education: Not on file   Highest education level: Not on file  Occupational History   Not on file  Tobacco Use   Smoking  status: Never   Smokeless tobacco: Never  Substance and Sexual Activity   Alcohol use: Not on file   Drug use: Not on file   Sexual activity: Not on file  Other Topics Concern   Not on file  Social History Narrative   ** Merged History Encounter **       Social Drivers of Health   Financial Resource Strain: Low Risk  (08/21/2023)   Received from [facility]   Overall Financial Resource Strain (CARDIA)    Difficulty of Paying Living Expenses: Not hard at all  Food Insecurity: No Food Insecurity (08/21/2023)   Received from [facility]   Hunger Vital Sign    Within the past 12 months, you worried that your food would run out before you got the money to buy more.: Never true    Within the past 12 months, the food you bought just didn't last and you didn't have money to get more.: Never true  Transportation Needs: No Transportation Needs (08/21/2023)   Received from [facility] - Transportation    In the past 12 months, has lack of transportation kept you from medical appointments or from getting medications?: No    Lack of Transportation (Non-Medical): No  Physical Activity: Not on file  Stress: Not on file  Social Connections: Not on file      Review of Systems: A 12 point ROS discussed and pertinent positives are indicated in the HPI above.  All other systems are negative.  Review of Systems  Vital Signs: There were no vitals taken for this visit.    Physical Exam  Imaging:   Labs:  CBC: No results for input(s): WBC, HGB, HCT, PLT in the last 8760 hours.  COAGS: No results for input(s): INR, APTT in the last 8760 hours.  BMP: No results for input(s): NA, K, CL, CO2, GLUCOSE, BUN, CALCIUM, CREATININE, GFRNONAA, GFRAA in the last 8760 hours.  Invalid input(s): CMP  LIVER FUNCTION TESTS: No results for input(s): BILITOT, AST, ALT, ALKPHOS, PROT, ALBUMIN  in the last 8760 hours.  TUMOR MARKERS: No results for input(s): AFPTM, CEA, CA199, CHROMGRNA in the last 8760 hours.  Assessment and Plan:  58 year old female with a history of postmenopausal bleeding secondary to uterine fibroids.   Thank you for this interesting consult.  I greatly enjoyed meeting Sydney Dean and look forward to participating in their care.  A copy of this report was sent to the requesting provider on this date.  Sydney Sides, MD Pager: [phone]    I spent a total of  40 Minutes   in face to face in clinical consultation, greater than 50% of which was counseling/coordinating care for symptomatic uterine fibroids.

## 2024-03-29 ENCOUNTER — Ambulatory Visit
Admission: RE | Admit: 2024-03-29 | Discharge: 2024-03-29 | Disposition: A | Discharge status: Home / Self Care | Source: Ambulatory Visit | Attending: Obstetrics & Gynecology | Admitting: Obstetrics & Gynecology

## 2024-03-29 DIAGNOSIS — D259 Leiomyoma of uterus, unspecified: Secondary | ICD-10-CM

## 2024-03-29 HISTORY — PX: IR RADIOLOGIST EVAL & MGMT: IMG5224

## 2024-04-02 ENCOUNTER — Other Ambulatory Visit: Payer: Self-pay | Admitting: Interventional Radiology

## 2024-04-02 DIAGNOSIS — D259 Leiomyoma of uterus, unspecified: Secondary | ICD-10-CM

## 2024-04-02 DIAGNOSIS — N938 Other specified abnormal uterine and vaginal bleeding: Secondary | ICD-10-CM

## 2024-04-17 ENCOUNTER — Telehealth (HOSPITAL_COMMUNITY): Payer: Self-pay | Admitting: Student

## 2024-04-17 ENCOUNTER — Telehealth: Payer: Self-pay

## 2024-04-17 MED ORDER — KETOROLAC TROMETHAMINE 10 MG PO TABS: 10.0000 mg | ORAL_TABLET | Freq: Four times a day (QID) | ORAL | 0 refills | Status: AC

## 2024-04-17 MED ORDER — OXYCODONE-ACETAMINOPHEN 5-325 MG PO TABS: 1.0000 | ORAL_TABLET | ORAL | 0 refills | Status: AC | PRN

## 2024-04-17 NOTE — Progress Notes (Signed)
 See telephone note

## 2024-04-17 NOTE — Discharge Instructions (Signed)
 Uterine Artery Embolization After Care    After the procedure, it is common to have mild pain or discomfort at the arterial entry site. It is also common to experience uterine cramping. These cramps can vary in strength from what you would consider to be a bad menstrual cycle all the way up to as severe as labor pains. The cramping is typically the most severe the afternoon and evening the day of the procedure and begin to improve the next day and each day thereafter. You may have vaginal bleeding. Your cycle may become irregular the first several months. You may have vaginal discharge. We recommend you wear a panty liner for first 4-6 weeks following your procedure.  You could also experience nausea and vomiting.  Take your medicines exactly as told, at the same time every day. This is vital to helping you with a smooth recovery.   Toradol  10mg  is a non-steroidal anti-inflammatory medicine that is critical in keeping your inflammation and pain under control.  You must take this every 6 hours for 3 days.  Motrin /Advill 800mg  is also a non-steroidal anti-inflammatory medicine that is critical in keeping your inflammation and pain under control.  You must take this every 8 hours for 4 days AFTER Toradol  has been completed.  Percocet (oxycodone /acetaminophen ) is a combination narcotic pain medication with Tylenol  to help with your pain.  Take it every 4 hours regardless of your pain level the first 2 days.  Set an alarm to wake up so you don't miss a dose overnight and get behind on your pain control.  After the first 48 hrs, if your pain is minimal you can take this only as needed.    Colace (docusate sodium) 100mg  is a stool softener to help prevent constipation.  The pain medications often cause constipation which can be particularly uncomfortable after COLOMBIA.  We recommend you take this at twice a day as prescribed while you are using Oxycodone /Percocet.   Phenergan (promethazine) 12.5mg  is a  medication for nausea and/or vomiting.  Take this every 4 hours as needed.   You may resume your regular prescription medications, as well.  Leave your bandage on for 24 hrs and keep the area dry. You may remove the bandage after 24 hrs and then shower as normal. Do not submerge in a bath, pool or hot tub until the small incision is completely healed (5-7 days). Do not lift anything that is heavier than 5 lb (2.3 kg) for the first 3 days. Return to your normal activities after day 5, though progress slowly and listen to your body.   Many women find a hot water bottle or heating pad helpful when placed on the lower abdomen.  This is fine to do if it helps your cramps.  Do not use any products that contain nicotine or tobacco. These products include cigarettes, chewing tobacco, and vaping devices, such as e-cigarettes. These can delay incision healing. If you need help quitting, ask your health care provider.  You may resume sexual activity when you feel up to it. Do not drink alcohol until you no longer are taking narcotic pain medicine.    Please contact our office at [phone] for any signs of an infection, such as a fever greater than 100 degrees, redness/swelling/pain around the incision site, fluid or blood oozing from the incision site, if the incision feels warm to the touch, if you have pus or a bad smell coming from your incision or vagina, if you have a rash,  if you have nausea or cannot eat anything without vomiting or if you have continued vaginal discharge that is not getting lighter.  If you need to speak to someone after business hours or over the weekend, please call the on-call Interventional Radiologist service at [phone]. Tell them you are a patient of Dr. Terrill and you had a Uterine Artery Embolization, along with any issues you are having.    Thanks for visiting [facility]!

## 2024-04-17 NOTE — Telephone Encounter (Signed)
 Patient scheduled for uterine artery embolization 04/19/24 with Dr. Jennefer. Prescriptions for toradol  and percocet were e-prescribed. Patient declined prescriptions for ibuprofen, phenergan and colace.   Ritesh Opara, AGACNP-BC 04/17/2024, 3:35 PM

## 2024-04-19 ENCOUNTER — Ambulatory Visit
Admission: RE | Admit: 2024-04-19 | Discharge: 2024-04-19 | Disposition: A | Discharge status: Home / Self Care | Source: Ambulatory Visit | Attending: Interventional Radiology | Admitting: Interventional Radiology

## 2024-04-19 DIAGNOSIS — N926 Irregular menstruation, unspecified: Secondary | ICD-10-CM | POA: Insufficient documentation

## 2024-04-19 DIAGNOSIS — D649 Anemia, unspecified: Secondary | ICD-10-CM | POA: Insufficient documentation

## 2024-04-19 DIAGNOSIS — Z9889 Other specified postprocedural states: Secondary | ICD-10-CM | POA: Insufficient documentation

## 2024-04-19 DIAGNOSIS — D259 Leiomyoma of uterus, unspecified: Secondary | ICD-10-CM

## 2024-04-19 DIAGNOSIS — R87610 Atypical squamous cells of undetermined significance on cytologic smear of cervix (ASC-US): Secondary | ICD-10-CM | POA: Insufficient documentation

## 2024-04-19 HISTORY — PX: IR EMBO TUMOR ORGAN ISCHEMIA INFARCT INC GUIDE ROADMAPPING: IMG5449

## 2024-04-19 MED ORDER — SODIUM CHLORIDE 0.9 % IV SOLN: INTRAVENOUS | Status: DC

## 2024-04-19 MED ORDER — MIDAZOLAM HCL 2 MG/2ML IJ SOLN
1.0000 mg | INTRAMUSCULAR | Status: DC | PRN
  Administered 2024-04-19 (×2): 1 mg via INTRAVENOUS

## 2024-04-19 MED ORDER — KETOROLAC TROMETHAMINE 30 MG/ML IJ SOLN
30.0000 mg | Freq: Once | INTRAMUSCULAR | Status: AC
  Administered 2024-04-19: 30 mg via INTRAVENOUS

## 2024-04-19 MED ORDER — LIDOCAINE HCL (PF) 1 % IJ SOLN
5.0000 mL | Freq: Once | INTRAMUSCULAR | Status: AC
  Administered 2024-04-19: 5 mL

## 2024-04-19 MED ORDER — IOPAMIDOL (ISOVUE-300) INJECTION 61%
100.0000 mL | Freq: Once | INTRAVENOUS | Status: AC | PRN
  Administered 2024-04-19: 75 mL via INTRA_ARTERIAL

## 2024-04-19 MED ORDER — LIDOCAINE-EPINEPHRINE 1 %-1:100000 IJ SOLN
10.0000 mL | Freq: Once | INTRAMUSCULAR | Status: AC
  Administered 2024-04-19: 10 mL via INTRADERMAL

## 2024-04-19 MED ORDER — SODIUM CHLORIDE 0.9% FLUSH
3.0000 mL | Freq: Two times a day (BID) | INTRAVENOUS | Status: DC
  Administered 2024-04-19: 3 mL via INTRAVENOUS

## 2024-04-19 MED ORDER — ONDANSETRON HCL 4 MG/2ML IJ SOLN: 4.0000 mg | Freq: Four times a day (QID) | INTRAMUSCULAR | Status: DC | PRN

## 2024-04-19 MED ORDER — FENTANYL CITRATE PF 50 MCG/ML IJ SOSY
25.0000 ug | PREFILLED_SYRINGE | INTRAMUSCULAR | Status: DC | PRN
  Administered 2024-04-19: 25 ug via INTRAVENOUS
  Administered 2024-04-19: 50 ug via INTRAVENOUS

## 2024-04-19 MED ORDER — SODIUM CHLORIDE 0.9 % IV SOLN
8.0000 mg | Freq: Once | INTRAVENOUS | Status: AC
  Administered 2024-04-19: 8 mg via INTRAVENOUS

## 2024-04-19 MED ORDER — PANTOPRAZOLE SODIUM 40 MG IV SOLR
40.0000 mg | Freq: Once | INTRAVENOUS | Status: AC
  Administered 2024-04-19: 40 mg via INTRAVENOUS

## 2024-04-19 MED ORDER — ACETAMINOPHEN 10 MG/ML IV SOLN
1000.0000 mg | Freq: Once | INTRAVENOUS | Status: AC
  Administered 2024-04-19: 1000 mg via INTRAVENOUS

## 2024-04-19 MED ORDER — NITROGLYCERIN 2 % TD OINT
0.5000 [in_us] | TOPICAL_OINTMENT | Freq: Once | TRANSDERMAL | Status: AC
  Administered 2024-04-19: 0.5 [in_us] via TOPICAL

## 2024-04-19 MED ORDER — HYDROMORPHONE HCL 1 MG/ML IJ SOLN: 1.0000 mg | Freq: Once | INTRAMUSCULAR | Status: DC

## 2024-04-19 MED ORDER — OXYCODONE HCL ER 10 MG PO T12A
10.0000 mg | EXTENDED_RELEASE_TABLET | Freq: Once | ORAL | Status: AC
  Administered 2024-04-19: 10 mg via ORAL

## 2024-04-19 MED ORDER — KETOROLAC TROMETHAMINE 30 MG/ML IJ SOLN
30.0000 mg | Freq: Once | INTRAMUSCULAR | Status: AC
  Administered 2024-04-19: 30 mg via INTRAMUSCULAR

## 2024-04-19 MED ORDER — CEFAZOLIN SODIUM-DEXTROSE 2-4 GM/100ML-% IV SOLN
2.0000 g | INTRAVENOUS | Status: AC
  Administered 2024-04-19: 2 g via INTRAVENOUS

## 2024-04-19 MED ORDER — DEXAMETHASONE SODIUM PHOSPHATE 10 MG/ML IJ SOLN
10.0000 mg | Freq: Once | INTRAMUSCULAR | Status: AC
  Administered 2024-04-19: 10 mg via INTRAVENOUS

## 2024-04-19 MED ORDER — VERAPAMIL HCL 2.5 MG/ML IV SOLN: Freq: Once | INTRA_ARTERIAL | Status: AC

## 2024-04-19 MED ORDER — ACETAMINOPHEN 325 MG PO TABS: 650.0000 mg | ORAL_TABLET | ORAL | Status: DC | PRN

## 2024-04-19 MED ORDER — LIDOCAINE 4 % EX CREA: TOPICAL_CREAM | Freq: Once | CUTANEOUS | Status: AC

## 2024-04-19 NOTE — Procedures (Signed)
 Interventional Radiology Procedure Note  Procedure: Uterine artery embolization  Findings: Please refer to procedural dictation for full description. Left radial artery access, TR band.  Complications: None immediate  Estimated Blood Loss: < 5 ml  Recommendations: IR will arrange 1 month outpatient follow up.   Ester Sides, MD

## 2024-04-19 NOTE — H&P (Signed)
 Chief Complaint: Patient was seen in consultation today for symptomatic uterine fibroids  Referring Physician(s): Dr. Damon Dean  Supervising Physician: Sydney Dean  Patient Status: [facility] Sydney Dean  History of Present Illness: Sydney Dean is a 58 y.o. female with past medical history of chronic Dean back pain, anemia related to symptomatic uterine fibroids characterized by persistent, heavy post-menopausal bleeding.  She met with Dr. Jennefer in consultation 03/29/24 to discuss management of her symptomatic fibroids.  She was determined to be a candidate for uterine artery embolization and presents for procedure today in her usual state of health.   She has been NPO.  She does not take blood thinners.   She is aware of the goals of the procedure today and is agreeable to proceed. Her husband is available for post-procedure care.   History reviewed. No pertinent past medical history.  Past Surgical History:  Procedure Laterality Date   IR RADIOLOGIST EVAL & MGMT  03/29/2024   REDUCTION MAMMAPLASTY      Allergies: Erythromycin  Medications: Prior to Admission medications   Medication Sig Start Date End Date Taking? Authorizing Provider  ketorolac  (TORADOL ) 10 MG tablet Take 1 tablet (10 mg total) by mouth every 6 (six) hours for 3 days. 04/17/24 04/20/24  Covington, Jamie R, NP  oxyCODONE -acetaminophen  (PERCOCET) 5-325 MG tablet Take 1 tablet by mouth every 4 (four) hours as needed for up to 20 doses for severe pain (pain score 7-10). 04/17/24   Tonette Warren SAUNDERS, NP  TAYTULLA 1-20 MG-MCG(24) CAPS  01/29/18   [provider]     History reviewed. No pertinent family history.  Social History   Socioeconomic History   Marital status: Single    Spouse name: Not on file   Number of children: Not on file   Years of education: Not on file   Highest education level: Not on file  Occupational History   Not on file  Tobacco Use   Smoking status: Never   Smokeless  tobacco: Never  Substance and Sexual Activity   Alcohol use: Not on file   Drug use: Not on file   Sexual activity: Not on file  Other Topics Concern   Not on file  Social History Narrative   ** Merged History Encounter **       Social Drivers of Health   Financial Resource Strain: Dean Risk  (08/21/2023)   Received from [facility]   Overall Financial Resource Strain (CARDIA)    Difficulty of Paying Living Expenses: Not hard at all  Food Insecurity: No Food Insecurity (08/21/2023)   Received from [facility]   Hunger Vital Sign    Within the past 12 months, you worried that your food would run out before you got the money to buy more.: Never true    Within the past 12 months, the food you bought just didn't last and you didn't have money to get more.: Never true  Transportation Needs: No Transportation Needs (08/21/2023)   Received from [facility] - Transportation    In the past 12 months, has lack of transportation kept you from medical appointments or from getting medications?: No    Lack of Transportation (Non-Medical): No  Physical Activity: Not on file  Stress: Not on file  Social Connections: Not on file     Review of Systems: A 12 point ROS discussed and pertinent positives are indicated in the HPI above.  All other systems are  negative.  Review of Systems  Constitutional:  Negative for fatigue and fever.  Respiratory:  Negative for cough and shortness of breath.   Cardiovascular:  Negative for chest pain.  Gastrointestinal:  Negative for abdominal pain, nausea and vomiting.  Genitourinary:  Positive for vaginal bleeding.  Musculoskeletal:  Negative for back pain.  Psychiatric/Behavioral:  Negative for behavioral problems and confusion.     Vital Signs: BP 108/65 (BP Location: Left Arm, Patient Position: Sitting, Cuff Size: Normal)   Pulse 65   Temp 98.2 F (36.8 C) (Oral)   Resp 16   Ht 5' 11  (1.803 m)   Wt 136 lb 14.5 oz (62.1 kg)   SpO2 98%   BMI 19.09 kg/m   Physical Exam Vitals and nursing note reviewed.  Constitutional:      General: She is not in acute distress.    Appearance: Normal appearance. She is not ill-appearing.  HENT:     Mouth/Throat:     Mouth: Mucous membranes are moist.     Pharynx: Oropharynx is clear.  Cardiovascular:     Rate and Rhythm: Normal rate and regular rhythm.  Pulmonary:     Effort: Pulmonary effort is normal. No respiratory distress.     Breath sounds: Normal breath sounds.  Musculoskeletal:     Cervical back: Normal range of motion.  Skin:    General: Skin is warm and dry.  Neurological:     General: No focal deficit present.     Mental Status: She is alert and oriented to person, place, and time. Mental status is at baseline.  Psychiatric:        Mood and Affect: Mood normal.        Behavior: Behavior normal.        Thought Content: Thought content normal.        Judgment: Judgment normal.      MD Evaluation Airway: WNL Heart: WNL Abdomen: WNL Chest/ Lungs: WNL ASA  Classification: 3 Mallampati/Airway Score: Two   Imaging: IR Radiologist Eval & Mgmt Result Date: 03/29/2024 EXAM: NEW PATIENT OFFICE VISIT CHIEF COMPLAINT: See Epic note. HISTORY OF PRESENT ILLNESS: See Epic note. REVIEW OF SYSTEMS: See Epic note. PHYSICAL EXAMINATION: See Epic note. ASSESSMENT AND PLAN: See Epic note. Ester Sides, MD [facility] Electronically Signed   By: Ester Sides M.D.   On: 03/29/2024 15:28   MR PELVIS W WO CONTRAST Result Date: 03/22/2024 CLINICAL DATA:  Uterine leiomyoma EXAM: MRI PELVIS WITHOUT AND WITH CONTRAST TECHNIQUE: Multiplanar multisequence MR imaging of the pelvis was performed both before and after administration of intravenous contrast. CONTRAST:  6 mL Vueway  gadolinium contrast IV COMPARISON:  None Available. FINDINGS: Urinary Tract:  No abnormality  visualized. Bowel:  Unremarkable visualized pelvic bowel loops. Vascular/Lymphatic: No pathologically enlarged lymph nodes. No significant vascular abnormality seen. Reproductive: Fibroid uterus, measuring 10.3 x 9.2 x 9.0 cm (series 6, image 17, series 4, image 20). Multiple large submucosal fibroids with severely distort the endometrial cavity. Largest fibroid is hypoenhancing and presumably partially degenerated, in the posterior fundus and measures 6.4 x 5.0 x 5.6 cm (series 6, image 18, series 4, image 20). Remaining fibroids are enhancing. Other:  None. Musculoskeletal: No suspicious bone lesions identified. IMPRESSION: 1. Fibroid uterus, measuring 10.3 x 9.2 x 9.0 cm. Multiple large submucosal fibroids with severely distort the endometrial cavity. 2. Largest fibroid is hypoenhancing and presumably partially degenerated, in the posterior fundus and measures 6.4 x 5.0 x 5.6 cm.  Remaining fibroids are enhancing. Electronically Signed   By: Marolyn JONETTA Jaksch M.D.   On: 03/22/2024 13:47    Labs:  CBC: DNo results for input(s): WBC, HGB, HCT, PLT in the last 8760 hours.  COAGS: No results for input(s): INR, APTT in the last 8760 hours.  BMP: No results for input(s): NA, K, CL, CO2, GLUCOSE, BUN, CALCIUM, CREATININE, GFRNONAA, GFRAA in the last 8760 hours.  Invalid input(s): CMP  LIVER FUNCTION TESTS: No results for input(s): BILITOT, AST, ALT, ALKPHOS, PROT, ALBUMIN in the last 8760 hours.  TUMOR MARKERS: No results for input(s): AFPTM, CEA, CA199, CHROMGRNA in the last 8760 hours.  Assessment and Plan: Symptomatic uterine fibroids  Sydney Dean is a 58 year female with history of heavy post-menopausal bleeding resulting in amenia related to uterine fibroids.  She desires intervention and has elected to proceed with uterine artery embolization.  She is offered phenegran, colace, ibuprofen, toradol , and percocet with tailored instructions to  maximize recovery effort.  She requests only toradol  and percocet be called in at this time- this was done yesterday by Warren Dais, NP and she has picked up from the pharmacy.  She is aware of the goals of the procedure and is agreeable to proceed.   The risks and benefits of embolization were discussed with the patient including, but not limited to bleeding, infection, vascular injury, post operative pain, or contrast induced renal failure.  This procedure involves the use of X-rays and because of the nature of the planned procedure, it is possible that we will have prolonged use of X-ray fluoroscopy.  Potential radiation risks to you include (but are not limited to) the following: - A slightly elevated risk for cancer several years later in life. This risk is typically less than 0.5% percent. This risk is Dean in comparison to the normal incidence of human cancer, which is 33% for women and 50% for men according to the American Cancer Society. - Radiation induced injury can include skin redness, resembling a rash, tissue breakdown / ulcers and hair loss (which can be temporary or permanent).   The likelihood of either of these occurring depends on the difficulty of the procedure and whether you are sensitive to radiation due to previous procedures, disease, or genetic conditions.   IF your procedure requires a prolonged use of radiation, you will be notified and given written instructions for further action.  It is your responsibility to monitor the irradiated area for the 2 weeks following the procedure and to notify your physician if you are concerned that you have suffered a radiation induced injury.    All of the patient's questions were answered, patient is agreeable to proceed. Consent signed and in chart.    Thank you for this interesting consult.  I greatly enjoyed meeting Raaga Maeder and look forward to participating in their care.  A copy of this report was sent to the  requesting provider on this date.  Electronically Signed: Alizon Schmeling Sue-Ellen Berda Shelvin, PA 04/19/2024, 8:59 AM   I spent a total of    15 Minutes in face to face in clinical consultation, greater than 50% of which was counseling/coordinating care for symptomatic uterine fibroids.

## 2024-04-22 ENCOUNTER — Telehealth (HOSPITAL_COMMUNITY): Payer: Self-pay | Admitting: Student

## 2024-04-22 ENCOUNTER — Telehealth: Payer: Self-pay

## 2024-04-22 MED ORDER — IBUPROFEN 800 MG PO TABS: 800.0000 mg | ORAL_TABLET | Freq: Three times a day (TID) | ORAL | 0 refills | Status: AC

## 2024-04-22 NOTE — Telephone Encounter (Signed)
 Ibuprofen 800 mg, one tablet Q8 hours x 4 days e-prescribed to [facility] in  [place].   Warren Dais, AGACNP-BC 04/22/2024, 10:43 AM

## 2024-04-22 NOTE — Progress Notes (Signed)
 See telephone note

## 2024-04-22 NOTE — Progress Notes (Signed)
 Jamie, NP to e-scribe Ibuprofen 800mg 

## 2024-04-29 ENCOUNTER — Other Ambulatory Visit: Payer: Self-pay | Admitting: Interventional Radiology

## 2024-04-29 DIAGNOSIS — D259 Leiomyoma of uterus, unspecified: Secondary | ICD-10-CM

## 2024-05-01 NOTE — Progress Notes (Signed)
 Patient ID: Sydney Dean, female   DOB: Feb 07, 1966, 58 y.o.   MRN: [id number]  Returned call to pt regarding moderate vaginal bleeding since COLOMBIA on 04/19/24. Dr. Jennefer made aware by Glade PEAK. Informed pt per Dr. Jennefer that some bleeding at this time is expected and to give it a little more time to subside. Pt to call back if symptoms worsen.

## 2024-05-15 NOTE — Progress Notes (Signed)
 This encounter was conducted via the [employer] providing interactive audio and visual communication.  The patient provided verbal consent to conduct a virtual appointment.  The patient was located at their primary residence during this encounter.  Referring Physician(s): Mody,Vaishali   Chief Complaint: The patient is seen in virtual video follow up today s/p uterine artery embolization 04/19/24  History of present illness: HPI from initial consultation 03/29/24 Sydney Dean is a 58 y.o. female with a medical history significant for chronic low back pain, anemia and uterine fibroids. She has been followed closely by her gynecology team for postmenopausal bleeding that is likely secondary to uterine fibroids. A hysteroscopy with endometrial biopsy was performed October 2024 with pathology showing benign findings. In May 2025 she had another hysteroscopy with polyp and myoma resection again with benign pathology. She is also on HRT.    She was seen urgently in early July due to heavy bleeding. She was started on megace but she doesn't tolerate this drug well. A recent in-office ultrasound showed a bulky uterus with multiple fibroids. Her gynecologist discussed several treatment options with her and the patient is most interested in a minimally invasive approach. Interventional Radiology was consulted and she was considered to be a possible candidate for uterine artery embolization.    A pelvic MRI was ordered for further work up and this was completed 03/20/24. The patient presents to the outpatient IR clinic to review these results and discuss COLOMBIA as a potential treatment option.   The patient was counseled on options for treatment of uterine fibroids with their accompanying symptoms of heavy menses, painful periods, and/or bulk symptoms including doing nothing, hormonal therapy, myomectomy, hysterectomy, and uterine artery embolization.   We discussed uterine artery embolization (COLOMBIA) as a  potential therapy.  We described the procedure itself, the risks, benefits and alternatives. She was in agreement to proceed and this was performed 04/19/24. She tolerated the procedure well and was discharged home the same day.  She presents today via virtual video visit for follow up.   No past medical history on file.  Past Surgical History:  Procedure Laterality Date   IR EMBO TUMOR ORGAN ISCHEMIA INFARCT INC GUIDE ROADMAPPING  04/19/2024   IR RADIOLOGIST EVAL & MGMT  03/29/2024   REDUCTION MAMMAPLASTY      Allergies: Erythromycin  Medications: Prior to Admission medications   Medication Sig Start Date End Date Taking? Authorizing Provider  oxyCODONE -acetaminophen  (PERCOCET) 5-325 MG tablet Take 1 tablet by mouth every 4 (four) hours as needed for up to 20 doses for severe pain (pain score 7-10). 04/17/24   Tonette Warren SAUNDERS, NP  TAYTULLA 1-20 MG-MCG(24) CAPS  01/29/18   [provider]     No family history on file.  Social History   Socioeconomic History   Marital status: Single    Spouse name: Not on file   Number of children: Not on file   Years of education: Not on file   Highest education level: Not on file  Occupational History   Not on file  Tobacco Use   Smoking status: Never   Smokeless tobacco: Never  Substance and Sexual Activity   Alcohol use: Not on file   Drug use: Not on file   Sexual activity: Not on file  Other Topics Concern   Not on file  Social History Narrative   ** Merged History Encounter **       Social Drivers of Health   Financial Resource Strain: Low Risk  (  08/21/2023)   Received from [facility]   Overall Financial Resource Strain (CARDIA)    Difficulty of Paying Living Expenses: Not hard at all  Food Insecurity: No Food Insecurity (08/21/2023)   Received from [facility]   Hunger Vital Sign    Within the past 12 months, you worried that your food would run out before you got the money to  buy more.: Never true    Within the past 12 months, the food you bought just didn't last and you didn't have money to get more.: Never true  Transportation Needs: No Transportation Needs (08/21/2023)   Received from [facility] - Transportation    In the past 12 months, has lack of transportation kept you from medical appointments or from getting medications?: No    Lack of Transportation (Non-Medical): No  Physical Activity: Not on file  Stress: Not on file  Social Connections: Not on file     Vital Signs: There were no vitals taken for this visit.  Physical Exam  Patient is alert, oriented and able to participate fully in the conversation. No apparent discomfort or distress observed. She appears appropriately dressed.    Imaging: No results found.  Labs:  CBC: No results for input(s): WBC, HGB, HCT, PLT in the last 8760 hours.  COAGS: No results for input(s): INR, APTT in the last 8760 hours.  BMP: No results for input(s): NA, K, CL, CO2, GLUCOSE, BUN, CALCIUM, CREATININE, GFRNONAA, GFRAA in the last 8760 hours.  Invalid input(s): CMP  LIVER FUNCTION TESTS: No results for input(s): BILITOT, AST, ALT, ALKPHOS, PROT, ALBUMIN in the last 8760 hours.  Assessment and Plan:  57 year old female with a history of symptomatic uterine fibroids now s/p a technically successful bilateral uterine artery embolization 04/19/24.   Electronically Signed: Warren JONELLE Dais 05/15/2024, 12:12 PM   I spent a total of 25 Minutes in virtual video clinical consultation, greater than 50% of which was counseling/coordinating care for symptomatic uterine fibroids.

## 2024-05-17 ENCOUNTER — Other Ambulatory Visit: Payer: Self-pay | Admitting: Interventional Radiology

## 2024-05-17 ENCOUNTER — Ambulatory Visit
Admission: RE | Admit: 2024-05-17 | Discharge: 2024-05-17 | Disposition: A | Discharge status: Home / Self Care | Source: Ambulatory Visit | Attending: Interventional Radiology | Admitting: Interventional Radiology

## 2024-05-17 DIAGNOSIS — D259 Leiomyoma of uterus, unspecified: Secondary | ICD-10-CM

## 2024-05-17 HISTORY — PX: IR RADIOLOGIST EVAL & MGMT: IMG5224

## 2024-05-29 ENCOUNTER — Telehealth (HOSPITAL_COMMUNITY): Payer: Self-pay | Admitting: Student

## 2024-05-29 NOTE — Telephone Encounter (Signed)
 Patient with history of uterine artery embolization 04/19/24 performed by Dr. Jennefer. Patient followed up with Dr. Jennefer 05/17/24 and reported that her abnormal bleeding had started to slow down somewhat. She called the IR clinic 05/28/24 and shared that the bleeding wasn't slowing down any further and wanted to know what additional options were available. She stated she was going to resume her hormonal pills to see if that helped. I encouraged her to give things more time and that we expect to see the full benefit of UAE by 3-4 months. At that time we could possibly consider a repeat UAE. The patient stated she was not interested in having that procedure performed again.   We discussed consulting with her gynecologist to see what alternate options were available and she stated this was the route she was going to take. She is scheduled to follow up with Dr. Jennefer 06/17/24 but she was encouraged to call the clinic prior to that appointment if she has any questions/concerns.   Warren Dais, AGACNP-BC 05/29/2024, 9:59 AM

## 2024-06-17 ENCOUNTER — Telehealth

## 2024-07-08 NOTE — Progress Notes (Signed)
 This encounter was conducted via the [employer] providing interactive audio and visual communication.  The patient provided verbal consent to conduct a virtual appointment.  The patient was located at their primary residence during this encounter.  Referring Physician(s): Mody,Vaishali   Chief Complaint: The patient is seen in virtual video follow up today s/p uterine artery embolization 04/19/24.   History of present illness: HPI from last clinic visit 05/17/24 Sydney Dean is a 58 y.o. female with a medical history significant for chronic low back pain, anemia and uterine fibroids. She has been followed closely by her gynecology team for postmenopausal bleeding that is likely secondary to uterine fibroids. A hysteroscopy with endometrial biopsy was performed October 2024 with pathology showing benign findings. In May 2025 she had another hysteroscopy with polyp and myoma resection again with benign pathology. She is also on HRT.    She was seen urgently in early July due to heavy bleeding. She was started on megace but she doesn't tolerate this drug well. A recent in-office ultrasound showed a bulky uterus with multiple fibroids. Her gynecologist discussed several treatment options with her and the patient is most interested in a minimally invasive approach. Interventional Radiology was consulted and she was considered to be a possible candidate for uterine artery embolization.    A pelvic MRI was ordered for further work up and this was completed 03/20/24. The patient presents to the outpatient IR clinic to review these results and discuss UAE as a potential treatment option.    The patient was counseled on options for treatment of uterine fibroids with their accompanying symptoms of heavy menses, painful periods, and/or bulk symptoms including doing nothing, hormonal therapy, myomectomy, hysterectomy, and uterine artery embolization.   We discussed uterine artery embolization (UAE)  as a potential therapy.  We described the procedure itself, the risks, benefits and alternatives. She was in agreement to proceed and this was performed 04/19/24. She tolerated the procedure well and was discharged home the same day.  She followed up via virtual video visit 05/17/24 and reported significant pain/cramping in the first 24 hours which had resolved by the time of our visit. She also shared that the bleeding had only recently started to decrease. Overall her response seemed delayed but promising. We discussed following up in one month to re-assess treatment response.   She called the IR clinic 05/29/24 with concerns that there had been no further decrease in the bleeding. She wanted to know what additional options were available and she was encouraged to give things more time. If after 3-4 months we could consider a repeat UAE. The patient stated she would also reach out to her gynecologist for alternate options.   She presents to the clinic today via virtual video visit for follow up.  Unfortunately she is still experiencing significant, daily bleeding.  She reports that it has slowed since prior to UAE, however persists.    No past medical history on file.  Past Surgical History:  Procedure Laterality Date   IR EMBO TUMOR ORGAN ISCHEMIA INFARCT INC GUIDE ROADMAPPING  04/19/2024   IR RADIOLOGIST EVAL & MGMT  03/29/2024   IR RADIOLOGIST EVAL & MGMT  05/17/2024   REDUCTION MAMMAPLASTY      Allergies: Erythromycin  Medications: Prior to Admission medications  Medication Sig Start Date End Date Taking? Authorizing Provider  oxyCODONE -acetaminophen  (PERCOCET) 5-325 MG tablet Take 1 tablet by mouth every 4 (four) hours as needed for up to 20 doses for severe pain (  pain score 7-10). 04/17/24   Covington, Jamie R, NP  TAYTULLA 1-20 MG-MCG(24) CAPS  01/29/18   [provider]     No family history on file.  Social History   Socioeconomic History   Marital status: Single     Spouse name: Not on file   Number of children: Not on file   Years of education: Not on file   Highest education level: Not on file  Occupational History   Not on file  Tobacco Use   Smoking status: Never   Smokeless tobacco: Never  Substance and Sexual Activity   Alcohol use: Not on file   Drug use: Not on file   Sexual activity: Not on file  Other Topics Concern   Not on file  Social History Narrative   ** Merged History Encounter **       Social Drivers of Health   Tobacco Use: Low Risk (04/19/2024)   Patient History    Smoking Tobacco Use: Never    Smokeless Tobacco Use: Never    Passive Exposure: Not on file  Financial Resource Strain: Low Risk  (08/21/2023)   Received from [facility]   Overall Financial Resource Strain (CARDIA)    Difficulty of Paying Living Expenses: Not hard at all  Food Insecurity: No Food Insecurity (08/21/2023)   Received from [facility]   Epic    Within the past 12 months, you worried that your food would run out before you got the money to buy more.: Never true    Within the past 12 months, the food you bought just didn't last and you didn't have money to get more.: Never true  Transportation Needs: No Transportation Needs (08/21/2023)   Received from [facility] - Transportation    In the past 12 months, has lack of transportation kept you from medical appointments or from getting medications?: No    Lack of Transportation (Non-Medical): No  Physical Activity: Not on file  Stress: Not on file  Social Connections: Not on file  Depression (EYV7-0): Not on file  Alcohol Screen: Not on file  Housing: Low Risk  (08/21/2023)   Received from [facility]   Epic    In the last 12 months, was there a time when you were not able to pay the mortgage or rent on time?: No    In the past 12 months, how many times have you moved where you were living?: 0    At any time in  the past 12 months, were you homeless or living in a shelter (including now)?: No  Utilities: Not At Risk (08/21/2023)   Received from [facility]    Threatened with loss of utilities: No  Health Literacy: Not on file     Vital Signs: There were no vitals taken for this visit.  Physical Exam  Patient is alert, oriented and able to participate fully in the conversation. No apparent discomfort or distress observed. She appears appropriately dressed.    Imaging:  MR Pelvis 03/20/24  IMPRESSION: 1. Fibroid uterus, measuring 10.3 x 9.2 x 9.0 cm. Multiple large submucosal fibroids with severely distort the endometrial cavity. 2. Largest fibroid is hypoenhancing and presumably partially degenerated, in the posterior fundus and measures 6.4 x 5.0 x 5.6 cm. Remaining fibroids are enhancing.     Labs:  CBC: No results for input(s): WBC, HGB, HCT, PLT in the last  8760 hours.  COAGS: No results for input(s): INR, APTT in the last 8760 hours.  BMP: No results for input(s): NA, K, CL, CO2, GLUCOSE, BUN, CALCIUM, CREATININE, GFRNONAA, GFRAA in the last 8760 hours.  Invalid input(s): CMP  LIVER FUNCTION TESTS: No results for input(s): BILITOT, AST, ALT, ALKPHOS, PROT, ALBUMIN in the last 8760 hours.  Assessment and Plan: 58 year old female with a history of symptomatic uterine fibroids now status post a technically successful bilateral uterine artery embolization 04/19/24.  She has had a difficult post-procedure course with severe pain in the immediate post-procedure window now with continued, daily abnormal uterine bleeding.  Given that we are now nearly 3 months post-procedure, it is unlikely for her to have any continued delayed response to UAE.  We discussed that she is following up with her OBGYN tomorrow and will discuss alternative measures such as medication, endometrial ablation, or  hysterectomy.  Follow up with IR as needed.  Ester Sides, MD Pager: [phone]    I spent a total of 25 Minutes in virtual video clinical consultation, greater than 50% of which was counseling/coordinating care for symptomatic uterine fibroids.

## 2024-07-10 ENCOUNTER — Inpatient Hospital Stay: Admission: RE | Admit: 2024-07-10 | Discharge: 2024-07-10 | Attending: Interventional Radiology

## 2024-07-10 DIAGNOSIS — D259 Leiomyoma of uterus, unspecified: Secondary | ICD-10-CM

## 2024-07-10 HISTORY — PX: IR RADIOLOGIST EVAL & MGMT: IMG5224

## 2024-08-27 ENCOUNTER — Other Ambulatory Visit: Payer: Self-pay | Admitting: Obstetrics & Gynecology

## 2024-10-10 ENCOUNTER — Ambulatory Visit (HOSPITAL_COMMUNITY): Admit: 2024-10-10 | Admitting: Obstetrics & Gynecology
